# Patient Record
Sex: Female | Born: 1980
Health system: Southern US, Community
[De-identification: ages and names within clinical notes are randomized; demographics above are authoritative.]

## PROBLEM LIST (undated history)

## (undated) DIAGNOSIS — B019 Varicella without complication: Secondary | ICD-10-CM

## (undated) DIAGNOSIS — Z803 Family history of malignant neoplasm of breast: Secondary | ICD-10-CM

## (undated) DIAGNOSIS — O88219 Thromboembolism in pregnancy, unspecified trimester: Secondary | ICD-10-CM

## (undated) DIAGNOSIS — O88213 Thromboembolism in pregnancy, third trimester: Secondary | ICD-10-CM

## (undated) HISTORY — PX: WISDOM TOOTH EXTRACTION: SHX21

## (undated) HISTORY — DX: Family history of malignant neoplasm of breast: Z80.3

## (undated) HISTORY — DX: Varicella without complication: B01.9

## (undated) HISTORY — DX: Thromboembolism in pregnancy, unspecified trimester: O88.219

## (undated) HISTORY — DX: Thromboembolism in pregnancy, third trimester: O88.213

---

## 2011-06-06 LAB — HM MAMMOGRAPHY: HM Mammogram: NORMAL

## 2014-12-05 NOTE — L&D Delivery Note (Signed)
Delivery Summary for Andrea Perkins  Labor Events:   Preterm labor:   Rupture date:   Rupture time:   Rupture type: Artificial  Fluid Color: Light Meconium  Induction:   Augmentation:   Complications:   Cervical ripening:          Delivery:   Episiotomy:   Lacerations:   Repair suture:   Repair # of packets:   Blood loss (ml):  250   Information for the patient's newborn:  Janesa, Dockery [[id number]]    Delivery 09/24/2015 1:02 AM by  Vaginal, Spontaneous Delivery Sex:  female Gestational Age: [redacted]w[redacted]d Delivery Clinician:  [place]?: Yes        APGARS  One minute Five minutes Ten minutes  Skin color: 1   1      Heart rate: 2   2      Grimace: 2   2      Muscle tone: 2   2      Breathing: 2   2      Totals: 9  9      Presentation/position:      Resuscitation: None  Cord information: 3 vessels   Disposition of cord blood:     Blood gases sent?  Complications:   Placenta: Delivered: 09/24/2015 1:06 AM  Spontaneous  Intact appearance Newborn Measurements: Weight: 8 lb 11.3 oz (3950 g)  Height: 22"  Head circumference: 35 cm  Chest circumference: 35 cm  Other providers: Registered Nurse Transition RN Marcille Buffy  Additional  information: Forceps:   Vacuum:   Breech:   Observed anomalies       Delivery Note At 1:02 AM a viable and healthy female was delivered via Vaginal, Spontaneous Delivery (Presentation: cephalic; LOA position  ).  APGAR: 9, 9; weight 8 lb 11.3 oz (3950 g).   Placenta status: Intact, Spontaneous.  Cord: 3 vessels with the following complications: nuchal cord x 1, reducible.  Cord pH: not obtained.  Cord blood not obtained.  Anesthesia: Epidural  Episiotomy:   Lacerations: 1st degree;Perineal Suture Repair: 3.0 (in figure-of-eight fashion) Est. Blood Loss (mL): 250  Mom to postpartum.  Baby to Couplet care / Skin to Skin.  Andrea Perkins 09/24/2015, 1:35 AM

## 2015-01-06 LAB — HM PAP SMEAR: HM Pap smear: NORMAL

## 2015-01-14 ENCOUNTER — Ambulatory Visit (INDEPENDENT_AMBULATORY_CARE_PROVIDER_SITE_OTHER): Payer: BLUE CROSS/BLUE SHIELD | Admitting: Internal Medicine

## 2015-01-14 ENCOUNTER — Encounter: Payer: Self-pay | Admitting: Internal Medicine

## 2015-01-14 VITALS — BP 100/64 | HR 70 | Temp 98.0°F | Ht 63.75 in | Wt 109.0 lb

## 2015-01-14 DIAGNOSIS — G5712 Meralgia paresthetica, left lower limb: Secondary | ICD-10-CM | POA: Insufficient documentation

## 2015-01-14 DIAGNOSIS — Z23 Encounter for immunization: Secondary | ICD-10-CM

## 2015-01-14 DIAGNOSIS — Z0000 Encounter for general adult medical examination without abnormal findings: Secondary | ICD-10-CM | POA: Insufficient documentation

## 2015-01-14 MED ORDER — HYDROCORTISONE 2.5 % EX CREA: TOPICAL_CREAM | Freq: Three times a day (TID) | CUTANEOUS | Status: DC | PRN

## 2015-01-14 NOTE — Assessment & Plan Note (Signed)
Reassured--this is not a serious process

## 2015-01-14 NOTE — Addendum Note (Signed)
Addended by: Lurlean Nanny on: 01/14/2015 01:03 PM   Modules accepted: Orders

## 2015-01-14 NOTE — Progress Notes (Signed)
Pre visit review using our clinic review tool, if applicable. No additional management support is needed unless otherwise documented below in the visit note. 

## 2015-01-14 NOTE — Assessment & Plan Note (Signed)
Seems healthy Tdap today Will give Rx for hydrocortisone cream to try on the hemorrhoid

## 2015-01-14 NOTE — Progress Notes (Signed)
Subjective:    Patient ID: Andrea Perkins, female    DOB: Apr 21, 1981, 34 y.o.   MRN: [id number]  HPI Wanted to get established Has a number of concerns Did just see her gyn--- blood work reviewed  Has had some hemorrhoid problems since last child Has "bump" externally that bothers her Flares at times Used OTC cream but it didn't help much Some itching but no bleeding No constipation or straining  Some pain in upper left thigh Had ultrasound at vein doctor--everything was fine Mostly lateral on left thigh--feels funny like when kids are on it Noted that it worsened with each child and persists now No problems with exercise, etc  Having trouble conceiving again 3 children now but no success in past year Ovarian cyst found but not painful just last week May have had brief rupture last year--had severe pain briefly  Having trouble with her hair Thinning and brittle ?related to heat No scaling, etc  No current outpatient prescriptions on file prior to visit.   No current facility-administered medications on file prior to visit.    Allergies  Allergen Reactions  . Amoxicillin Anaphylaxis  . Milk-Related Compounds Hives    Along with congestion    Past Medical History  Diagnosis Date  . Chicken pox     Past Surgical History  Procedure Laterality Date  . Wisdom tooth extraction    . Wisdom tooth extraction    . Vaginal delivery      x 3    Family History  Problem Relation Age of Onset  . Cancer Mother     Breast  . Hypertension Mother   . Hyperlipidemia Mother   . Thyroid disease Sister   . Alcohol abuse Brother   . Drug abuse Brother   . Alcohol abuse Maternal Grandfather   . Drug abuse Maternal Grandfather   . Mental illness Maternal Grandfather     ?bipolar  . Cancer Brother     "pleural" cancer    History   Social History  . Marital Status: Married    Spouse Name: N/A  . Number of Children: 3  . Years of Education: N/A   Occupational  History  . Stay at home mom    Social History Main Topics  . Smoking status: Never Smoker   . Smokeless tobacco: Never Used  . Alcohol Use: No  . Drug Use: Not on file  . Sexual Activity: Not on file   Other Topics Concern  . Not on file   Social History Narrative  . No narrative on file   Review of Systems  Constitutional: Negative for fatigue and unexpected weight change.  HENT: Negative for dental problem, hearing loss and tinnitus.   Eyes: Negative for visual disturbance.  Respiratory: Negative for cough, chest tightness and shortness of breath.   Gastrointestinal: Negative for nausea, vomiting, abdominal pain, constipation and blood in stool.  Endocrine: Negative for polydipsia and polyuria.  Genitourinary: Negative for difficulty urinating and dyspareunia.       Periods are regular  Musculoskeletal: Negative for back pain, joint swelling and arthralgias.  Skin: Negative for rash.  Allergic/Immunologic: Negative for environmental allergies and immunocompromised state.  Neurological: Positive for numbness. Negative for dizziness, syncope, weakness, light-headedness and headaches.  Hematological: Negative for adenopathy. Does not bruise/bleed easily.  Psychiatric/Behavioral: Negative for sleep disturbance and dysphoric mood. The patient is not nervous/anxious.        Objective:   Physical Exam  Constitutional: She is oriented to person,  place, and time. She appears well-developed and well-nourished. No distress.  HENT:  Head: Normocephalic and atraumatic.  Right Ear: External ear normal.  Left Ear: External ear normal.  Mouth/Throat: Oropharynx is clear and moist. No oropharyngeal exudate.  Hair and scalp look fine  Eyes: Conjunctivae and EOM are normal. Pupils are equal, round, and reactive to light.  Neck: Normal range of motion. Neck supple. No thyromegaly present.  Cardiovascular: Normal rate, regular rhythm, normal heart sounds and intact distal pulses.  Exam  reveals no gallop.   No murmur heard. Pulmonary/Chest: Effort normal and breath sounds normal. No respiratory distress. She has no wheezes. She has no rales.  Abdominal: Soft. There is no tenderness.  Musculoskeletal: She exhibits no edema or tenderness.  Lymphadenopathy:    She has no cervical adenopathy.  Neurological: She is alert and oriented to person, place, and time.  Skin: No rash noted. No erythema.  Psychiatric: She has a normal mood and affect. Her behavior is normal.          Assessment & Plan:

## 2015-02-03 ENCOUNTER — Ambulatory Visit
Admit: 2015-02-03 | Disposition: A | Discharge status: Home / Self Care | Payer: Self-pay | Attending: Internal Medicine | Admitting: Internal Medicine

## 2015-02-03 DIAGNOSIS — O88219 Thromboembolism in pregnancy, unspecified trimester: Secondary | ICD-10-CM

## 2015-02-03 HISTORY — DX: Thromboembolism in pregnancy, unspecified trimester: O88.219

## 2015-02-17 LAB — OB RESULTS CONSOLE HGB/HCT, BLOOD
HCT: 37 %
Hemoglobin: 12.6 g/dL

## 2015-02-17 LAB — OB RESULTS CONSOLE HIV ANTIBODY (ROUTINE TESTING): HIV: NONREACTIVE

## 2015-02-17 LAB — OB RESULTS CONSOLE RPR: RPR: NONREACTIVE

## 2015-02-17 LAB — OB RESULTS CONSOLE GC/CHLAMYDIA
CHLAMYDIA, DNA PROBE: NEGATIVE
Gonorrhea: NEGATIVE

## 2015-02-17 LAB — OB RESULTS CONSOLE ABO/RH: RH Type: POSITIVE

## 2015-02-17 LAB — OB RESULTS CONSOLE VARICELLA ZOSTER ANTIBODY, IGG: VARICELLA IGG: IMMUNE

## 2015-02-17 LAB — OB RESULTS CONSOLE HEPATITIS B SURFACE ANTIGEN: HEP B S AG: NEGATIVE

## 2015-02-17 LAB — OB RESULTS CONSOLE ANTIBODY SCREEN: Antibody Screen: NEGATIVE

## 2015-02-17 LAB — OB RESULTS CONSOLE RUBELLA ANTIBODY, IGM: Rubella: IMMUNE

## 2015-02-28 ENCOUNTER — Inpatient Hospital Stay: Payer: Self-pay | Admitting: Internal Medicine

## 2015-02-28 DIAGNOSIS — R0602 Shortness of breath: Secondary | ICD-10-CM | POA: Diagnosis not present

## 2015-02-28 LAB — CBC
HCT: 35.9 % (ref 35.0–47.0)
HGB: 12 g/dL (ref 12.0–16.0)
MCH: 29.5 pg (ref 26.0–34.0)
MCHC: 33.4 g/dL (ref 32.0–36.0)
MCV: 88 fL (ref 80–100)
PLATELETS: 256 10*3/uL (ref 150–440)
RBC: 4.07 10*6/uL (ref 3.80–5.20)
RDW: 12.4 % (ref 11.5–14.5)
WBC: 9.4 10*3/uL (ref 3.6–11.0)

## 2015-02-28 LAB — TSH: Thyroid Stimulating Horm: <0.1 u[IU]/mL

## 2015-02-28 LAB — APTT: ACTIVATED PTT: 26.7 s (ref 23.6–35.9)

## 2015-02-28 LAB — COMPREHENSIVE METABOLIC PANEL
ANION GAP: 6 — AB (ref 7–16)
Albumin: 3.9 g/dL
Alkaline Phosphatase: 60 U/L
BILIRUBIN TOTAL: 0.4 mg/dL
BUN: <5 mg/dL
CO2: 24 mmol/L
CREATININE: 0.58 mg/dL
Calcium, Total: 9 mg/dL
Chloride: 105 mmol/L
Glucose: 110 mg/dL — ABNORMAL HIGH
Potassium: 3.8 mmol/L
SGOT(AST): 19 U/L
SGPT (ALT): 23 U/L
Sodium: 135 mmol/L
Total Protein: 7.3 g/dL

## 2015-02-28 LAB — URINALYSIS, COMPLETE
BLOOD: NEGATIVE
Bilirubin,UR: NEGATIVE
GLUCOSE, UR: NEGATIVE mg/dL (ref 0–75)
KETONE: NEGATIVE
NITRITE: NEGATIVE
Ph: 7 (ref 4.5–8.0)
Protein: NEGATIVE
RBC,UR: 1 /HPF (ref 0–5)
Specific Gravity: 1.01 (ref 1.003–1.030)
Squamous Epithelial: 5
WBC UR: 2 /HPF (ref 0–5)

## 2015-02-28 LAB — MAGNESIUM: Magnesium: 1.9 mg/dL

## 2015-02-28 LAB — LIPASE, BLOOD: Lipase: 34 U/L

## 2015-02-28 LAB — TROPONIN I
Troponin-I: <0.03 ng/mL
Troponin-I: <0.03 ng/mL

## 2015-02-28 LAB — HCG, QUANTITATIVE, PREGNANCY: Beta Hcg, Quant.: 225359 m[IU]/mL — ABNORMAL HIGH

## 2015-02-28 LAB — PROTIME-INR
INR: 1.1
PROTHROMBIN TIME: 14.4 s

## 2015-02-28 LAB — CK-MB
CK-MB: 0.4 ng/mL — AB
CK-MB: 0.4 ng/mL — AB

## 2015-02-28 LAB — HEMOGLOBIN A1C: Hemoglobin A1C: 5.2 %

## 2015-03-01 LAB — CBC WITH DIFFERENTIAL/PLATELET
BASOS PCT: 0.3 %
Basophil #: 0 10*3/uL (ref 0.0–0.1)
EOS ABS: 0 10*3/uL (ref 0.0–0.7)
EOS PCT: 0.7 %
HCT: 30.1 % — AB (ref 35.0–47.0)
HGB: 10.5 g/dL — ABNORMAL LOW (ref 12.0–16.0)
LYMPHS PCT: 29.6 %
Lymphocyte #: 1.8 10*3/uL (ref 1.0–3.6)
MCH: 30.3 pg (ref 26.0–34.0)
MCHC: 34.8 g/dL (ref 32.0–36.0)
MCV: 87 fL (ref 80–100)
Monocyte #: 0.6 x10 3/mm (ref 0.2–0.9)
Monocyte %: 9 %
NEUTROS ABS: 3.7 10*3/uL (ref 1.4–6.5)
Neutrophil %: 60.4 %
Platelet: 219 10*3/uL (ref 150–440)
RBC: 3.46 10*6/uL — ABNORMAL LOW (ref 3.80–5.20)
RDW: 12.3 % (ref 11.5–14.5)
WBC: 6.2 10*3/uL (ref 3.6–11.0)

## 2015-03-01 LAB — TROPONIN I: Troponin-I: <0.03 ng/mL

## 2015-03-01 LAB — COMPREHENSIVE METABOLIC PANEL
ALK PHOS: 51 U/L
AST: 15 U/L
Albumin: 3.1 g/dL — ABNORMAL LOW
Anion Gap: 5 — ABNORMAL LOW (ref 7–16)
BILIRUBIN TOTAL: 0.4 mg/dL
BUN: <5 mg/dL
CALCIUM: 8.3 mg/dL — AB
CHLORIDE: 110 mmol/L
CREATININE: 0.44 mg/dL
Co2: 20 mmol/L — ABNORMAL LOW
EGFR (African American): >60
EGFR (Non-African Amer.): >60
Glucose: 85 mg/dL
POTASSIUM: 3.7 mmol/L
SGPT (ALT): 17 U/L
Sodium: 135 mmol/L
Total Protein: 6.1 g/dL — ABNORMAL LOW

## 2015-03-01 LAB — HEPARIN LEVEL (UNFRACTIONATED): Anti-Xa(Unfractionated): 0.69 IU/mL (ref 0.30–0.70)

## 2015-03-01 LAB — URINE CULTURE

## 2015-03-01 LAB — CK-MB: CK-MB: 0.4 ng/mL — AB

## 2015-03-02 ENCOUNTER — Encounter: Payer: Self-pay | Admitting: Internal Medicine

## 2015-03-06 ENCOUNTER — Ambulatory Visit
Admit: 2015-03-06 | Disposition: A | Discharge status: Home / Self Care | Payer: Self-pay | Attending: Internal Medicine | Admitting: Internal Medicine

## 2015-03-09 ENCOUNTER — Ambulatory Visit
Admit: 2015-03-09 | Disposition: A | Discharge status: Home / Self Care | Payer: Self-pay | Attending: Internal Medicine | Admitting: Internal Medicine

## 2015-03-09 LAB — HEPARIN LEVEL (UNFRACTIONATED): ANTI-XA(UNFRACTIONATED): 0.66 [IU]/mL (ref 0.30–0.70)

## 2015-03-19 ENCOUNTER — Encounter
Admit: 2015-03-19 | Disposition: A | Discharge status: Home / Self Care | Payer: Self-pay | Attending: Obstetrics & Gynecology | Admitting: Obstetrics & Gynecology

## 2015-03-26 ENCOUNTER — Other Ambulatory Visit: Payer: Self-pay | Admitting: Oncology

## 2015-03-26 DIAGNOSIS — I2699 Other pulmonary embolism without acute cor pulmonale: Secondary | ICD-10-CM

## 2015-03-27 ENCOUNTER — Encounter: Payer: Self-pay | Admitting: Internal Medicine

## 2015-04-05 NOTE — Consult Note (Signed)
Referral Information:  Reason for Referral Acute pulmonary embolism in pregnancy   Referring Physician [facility]   Prenatal Hx Andrea Perkins is a 34 year-old G4 P3003 at 11 6/7 weeks who was diagnosed with an acute pulmonary embolism on 02/28/15. She presented to the ED with RUQ and right-sided chest pain. Her RUQ [place] was negative but sprial CT angiogram showed an acute pulmonary embolism. Her lower extremity dopplers were negative. She did not require intubation or ICU admission.  She is doing well. She is on therapeutic Lovenox (45 mg every 12 hours) with levels being followed by Dr. Aggie Cosier. She has had no bleedign complications and is without complaints.   Past Obstetrical Hx G4 P3003 2007: spontaneous vaginal delivery at 41 weeks, female fetus 8 pounds 0 ounces, sponteanous labor, no complications 2585: spontaneous vaginal delivery at 41 weeks, Induction of labor, female fetus 8 pounds 11 ounces, no complications 2778: spontaneous vaginal delivery at 40 weeks, spontaneous labor, female fetus 8 pounds, no complcations.   Home Medications: Medication Instructions Status  enoxaparin 45 milligram(s) subcutaneous 2 times a day Active  Diclegis 10 mg-10 mg oral delayed release tablet 1 to 2 tab(s) orally once a day (at bedtime) as needed for nausea, vomiting Active   Allergies:   Amoxicillin: Anaphylaxis, Itching  Lactose: Do NOT Use  Vital Signs/Notes:  Nursing Vital Signs: **Vital Signs.:   14-Apr-16 13:19  Pulse Pulse 242  Systolic BP Systolic BP 353  Diastolic BP (mmHg) Diastolic BP (mmHg) 62   Perinatal Consult:  PGyn Hx Denies history of abnormal paps or STDs   Past Medical History cont'd Acute Pulmonary embolism 2016 No other mecical problems   PSurg Hx None   FHx Denies FH of birth defects, mental retardation or geneic disorders. 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   Soc Hx married, Denies use of tobacco, ETOH or drugs   Review Of Systems:  Cough  No   Abdominal Pain No   SOB/DOE No   Chest Pain No    Additional Lab/Radiology Notes CT angiogram [facility], 02/28/15): Pulmonary embolism in right middle and lower lobes CXR [facility], 02/28/15): Normal  RUQ [place] [facility], 02/28/15): Normal except gallbladder polyps Ob US [facility], 02/28/15): Single live IUP at 10 1/7 weeks LE dopplers [facility], 02/28/15): No evidence of DVT Cardiac echo [facility], 02/28/15): LVEF 60-65%, Normal Right RV function  Thrombophilia panel [facility], 02/28/15): Protein S total 82%, Protein C antigen 74%, Prothrombin Gene mutation normal, Factor V Leiden no mutation, AT III 101%, anti caridiolipin antibody neg   Impression/Recommendations:  Impression 34 year-old G4 P3003 at 3 6/7 weeks recently diagnosed with an acute pulmonary embolism during pregnancy. She is stable, in on therapeutic lovenox without complications and being followed by Dr. Aggie Cosier.   Recommendations 1. Acute PE in pregnancy. We discussed the need to stay on therapeutic lovenox therapy during pregnancy and the immediate postpartum period. Dr. Aggie Cosier will then determine the total length of therapy following the pregnancy. He is also following her levels now and adjusting her lovenox dosing appropriately.  We have added an anti-beta2 glycoprotein antiboyd testing today. If positive, will recommned adding a daily baby aspirin.  Recs: -Continue therapeutic lovenox during the pregnancy wtih dose-adjustments by Dr. Aggie Cosier -Scheduled induction of labor at 39 weeks, having held her Lovenox for 24hours piror -SCDs on while in labor -Restart Lovenox 12 hours after a vaginal delivery or 24 hours after a cesarean delivery. Dr. Aggie Cosier can help with postpartum dosing recommnedations. Continue  Lovenox for the immediate postpartum period and then Dr. Aggie Cosier will help determine when she can transition to coumadin. -Detailed anatomy scan at 18 weeks, followed by monthly growth scans starting at 24 weeks. We will be happy to  perform any of these scans if desired -Weekly fetal testing to begin at 32 weeks, increasing to twice weekly at 34 weeks -Avoid estrogen-containing contraception -She declines aneuploidy and ONTD screening. -We also sent a CBC to ensure her platelet count is normal.    Total Time Spent with Patient 60 minutes   >50% of visit spent in couseling/coordination of care yes   Office Use Only 99244  Level 4 (52min) NEW office consult low complexity   Coding Description: OTHER: Pulmonary embolism in pregnancy.  Electronic Signatures: Tadao Emig, [place] (MD)  (Signed 14-Apr-16 16:08)  Authored: Referral, Home Medications, Allergies, Vital Signs/Notes, Consult, Exam, Lab/Radiology Notes, Impression, Billing, Coding Description   Last Updated: 14-Apr-16 16:08 by Jermell Holeman, [place] (MD)

## 2015-04-05 NOTE — H&P (Signed)
PATIENT NAME:  Andrea Perkins, Andrea Perkins MR#:  [id number] DATE OF BIRTH:  [birth year]  DATE OF ADMISSION:  02/28/2015  PRIMARY CARE PHYSICIAN: Venia Carbon, MD    HISTORY OF PRESENT ILLNESS: The patient is a 35 year old Caucasian female with no significant past medical history, who has presented to the hospital with complaints of right rib pain, right shoulder pain, back pain, which started on Thursday, 2 days ago, in the evening. Pain is constant with intermittent worsening, but especially it is uncomfortable whenever she takes deep breaths or when she lies down on the right side or lies down on her back. She denies any fevers or chills, but admits to having some breathing difficulty with pain. Because of the discomfort, she decided to come to the Emergency Room for further evaluation. In the Emergency Room, she had CT scan of her chest, which revealed pulmonary embolism and hospital services were contacted for admission.   PAST MEDICAL HISTORY: None, except of nausea and vomiting because of her pregnancy. She is [redacted] weeks pregnant.   MEDICATIONS: Diclegis 10 mg/10 mg delayed release tablets once at bedtime as needed for nausea, vomiting.   PAST SURGICAL HISTORY:  Wisdom teeth removed.   ALLERGIES: AMOXICILLIN, WHICH SWELLS HER THROAT.   FAMILY HISTORY: Hypertension and hyperlipidemia in the patient's mother, who also had breast carcinoma. The patient's sister had losses of pregnancy. The patient's brother died of pleural-based carcinoma at the age of 36.   SOCIAL HISTORY: The patient is married, has 3 children at home, who she delivered one after the other, and now she is pregnant child with the 4th child, 10 weeks pregnancy. No smoking or alcohol abuse. 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   REVIEW OF SYSTEMS:  CONSTITUTIONAL: Positive for pains in the right side of her body, weight loss approximately 6 pounds over the past 10 weeks because of nausea,  vomiting. Admits to having some shortness of breath with nausea, vomiting. Feeling presyncopal earlier today. Denies any fevers, chills, fatigue, weakness in the muscles.  HEENT: Denies blurry vision, double vision, glaucoma, or cataract, no swollen glands,  difficulty swallowing.  RESPIRATORY: Denies cough, wheezes, hemoptysis, asthma, COPD.  CARDIOVASCULAR: Denies any chest pain, orthopnea, edema. No palpitations or syncope.  GASTROINTESTINAL: Denies any diarrhea, rectal bleeding, change in bowel habits.  GENITOURINARY: Denies dysuria, hematuria, frequency, incontinence.  ENDOCRINE: Denies thyroid problems, heat or cold intolerance.  HEMATOLOGIC: Denies anemia, easy bruising, bleeding, swollen glands. SKIN: Denies acne, rash, or change in moles.  MUSCULOSKELETAL: Denies arthritis, pain, swelling.  NEUROLOGICAL: Denies any numbness, epilepsy or tremor.  PSYCHIATRIC: Denies anxiety, insomnia, or depression.   PHYSICAL EXAMINATION:  VITAL SIGNS:  On arrival to the hospital, the patient's vital signs: Temperature was 98, pulse was 106, respirations 24, blood pressure 119/70, saturation was 99% on room air.  GENERAL: This is a well-developed, well-nourished Caucasian female, pale and uncomfortable lying on the stretcher.  HEENT: Her pupils are equal and reactive to light. Extraocular intact, no icterus or conjunctivitis. Has normal hearing. No pharyngeal erythema, mucosa is moist.  NECK: No masses. Supple, nontender. Thyroid is not enlarged, no lymphadenopathy, no JVD or carotid bruits bilaterally. Full range of motion.  LUNGS: Clear to auscultation in all fields. A few crackles were heard on the right and somewhat diminished breath sounds on the right because of poor entrance, poor effort. No labored inspirations, dullness to percussion. Not in overt respiratory distress.  CARDIOVASCULAR: S1,  S2 appreciated. Rate and rhythm was regular.  2 plus pedal pulses. No lower extremity edema, calf  tenderness, or cyanosis was noted.  ABDOMEN: Soft, nontender. Bowel sounds are present. No hepatosplenomegaly or masses were noted.   RECTAL: Deferred.  MUSCLE STRENGTH: Able to move all extremities. No cyanosis, degenerative joint disease, or kyphosis. Gait was not tested.  SKIN: Revealed no rashes, lesions, erythema, nodularity, or induration. It was warm and dry to palpation.  LYMPHATIC: No adenopathy in the cervical region.  NEUROLOGICAL: Cranial nerves II through XII intact. Sensory is intact. No dysarthria or aphasia. The patient is alert, oriented to time, person and place, cooperative. Memory is good.  PSYCHIATRIC: No significant confusion, agitation, or depression noted.   LABORATORY DATA: Glucose level of 110, otherwise unremarkable. Lipase level of 34, hCG beta subunit is 225,359. Liver enzymes normal. Troponin less than 0.03. CBC: Within normal limits, with white blood cell count 9.4, hemoglobin 12.0, platelet count 256,000. Coagulation panel was unremarkable. Urinalysis revealed 1+ bacteria, 2 white blood cells, 1 red blood cell, trace leukocyte esterase, and 5 epithelial cells, mucus was present as well as amorphous crystals, hazy urine color.   RADIOLOGIC STUDIES: The patient did have chest x-ray, portable single view, 02/28/2015, showed no acute cardiopulmonary disease. No displaced rib fractures were identified. CT scan of chest with IV contrast for pulmonary embolism evaluation showed pulmonary embolus in pulmonary arteries on the right middle and lower lung lobe, extended distally in distal segmental branches, moderate clot burden noted, right ventricular/ left ventricular ratio remains within normal limits at 0.76, small focal pulmonary infarct in the right lung base, minimal atelectasis in the medial right upper lobe. Ultrasound of abdomen, limited survey, done 02/28/2015, showed echogenic foci adherent to the gallbladder wall, most consistent with gallbladder polyps, largest  measures 4.7 mm. Recommends sonographic follow up in 1 year. No cholelithiasis or findings of cholecystitis. No biliary dilatation. OB/GYN ultrasound showed single live intrauterine pregnancy, estimated gestational age [redacted] weeks, 1 day for estimated date of delivery on 09/25/2015. Corpus luteal cyst in the left ovary, normal right ovary.   ASSESSMENT AND PLAN: 1. Pulmonary embolism. Admit the patient to the medical floor. Start her on Lovenox subcutaneously. Get hematologist/oncologist involved, and get lab studies to rule out coagulopathy, including anticardiolipin, antithrombin III, factor V Leiden mutation, as well as the factor II mutation analysis. : We will get Doppler ultrasound of her lower extremities, as well as echocardiogram, and we will continue oxygen therapy for now.  2. Nausea, vomiting related to pregnancy. We will continue IV fluids, as well as antiemetics. 3. Hyperglycemia. Get hemoglobin A1c.  4. Intrauterine pregnancy, live at 10 weeks. Get GYN involved for further recommendations.  5. Bacteruria as well as urinary tract infection. Get urine cultures and start the patient on Rocephin.  TIME SPENT: 50 minutes on the patient.    ____________________________ Theodoro Grist, MD rv:mw D: 02/28/2015 09:10:00 ET T: 02/28/2015 10:48:06 ET JOB#: [id number]  cc: Venia Carbon, MD Theodoro Grist, MD, <Dictator>   Delvecchio Madole MD ELECTRONICALLY SIGNED 03/15/2015 15:01

## 2015-04-05 NOTE — Consult Note (Signed)
PATIENT NAME:  Andrea Perkins, Andrea Perkins MR#:  [id number] DATE OF BIRTH:  [birth year]  DATE OF CONSULTATION:  02/28/2015  REFERRING PHYSICIAN:     Theodoro Grist, MD CONSULTING PHYSICIAN:  Hanford Lust R. Ma Hillock, MD  REASON FOR CONSULTATION: Pulmonary embolism in pregnant patient.   HISTORY OF PRESENT ILLNESS: The patient is a 34 year old female patient with no significant past medical history except for early pregnancy (ultrasound on admission estimates at 10 weeks 1 day intrauterine pregnancy), who presented with complaints of right lower chest pain and rib pain that started on Thursday. The pain has been constant and she had further evaluation including CT scan of the chest which revealed right middle lobe and right lower lobe pulmonary embolism with moderate clot burden. The patient has been started on Lovenox 1 mg/kg twice daily, says that currently pain has settled down with medication, and she has just been started on Lovenox today (morning). She denies any prior history of DVT or PE. Venous Doppler today is negative for any DVT either. She denies any known history of thromboembolic disorders in her parents or siblings, states that she has 6 siblings. This is first  pregnancy and states that she has had significant issues with nausea and emesis in the last 7 to 8 weeks and this has prevented her from feeling well and has been mostly resting or sitting for many weeks now.   PAST MEDICAL HISTORY AND PAST SURGICAL HISTORY: As stated above. Status post wisdom teeth removal.   FAMILY HISTORY: Remarkable for hypertension, hyperlipidemia in her mother who also had breast cancer. The patient's sister has history of pregnancy loss, but denies any history of antiphospholipid antibody syndrome. The patient's brother died of pleural-based carcinoma at age 29.   SOCIAL HISTORY: Denies smoking, alcohol, or recreational drug usage. Worked as a Pharmacist, hospital in the past. She has 3 children at home and currently pregnant with 4th  child.   ALLERGIES: INCLUDE AMOXICILLIN.   HOME MEDICATIONS: Diclegis 10 mg delayed release at bedtime as needed for nausea or  vomiting.   REVIEW OF SYSTEMS:  CONSTITUTIONAL: Generalized weakness for the last few weeks. No fever or chills. No night sweats.  HEENT: Currently denies any dizziness at rest, headaches, epistaxis. No ear or jaw pain. No sinus symptoms.  CARDIAC: No angina, palpitation, orthopnea, or PND.  LUNGS: Denies any dyspnea at rest, cough, hemoptysis. Right lower chest pain as above.  GASTROINTESTINAL: Having intermittent nausea and vomiting. No diarrhea. No bright red blood in stools or melena.  GENITOURINARY: No dysuria or hematuria.  SKIN: No new rashes or pruritus.  MUSCULOSKELETAL: No new bone pains.  EXTREMITIES: No new swelling or paresthesias.   NEUROLOGIC: No new focal weakness, seizures, or loss of consciousness.   PHYSICAL EXAMINATION:  GENERAL: The patient is sitting in bed, alert and oriented, and converses appropriately, no acute distress at rest. No icterus.  VITAL SIGNS: Temperature 97.9, heart rate 77, respiratory rate 20, blood pressure 101/66, oxygen saturation 99% on room air.  HEENT: Normocephalic, atraumatic. Extraocular movements intact. Sclerae anicteric.  NECK: Negative for lymphadenopathy.  CARDIOVASCULAR: S1 and S2, regular rate and rhythm.  LUNGS: Show bilateral good air entry, no crepitations or rhonchi.  ABDOMEN: Soft, nontender. No hepatosplenomegaly clinically. Bowel sounds present.  EXTREMITIES: Show no major edema or cyanosis.  LYMPHATICS: No adenopathy in axillary or inguinal areas.  NEUROLOGIC: Grossly nonfocal, cranial nerves intact.   LABORATORY DATA: INR 1.1, PTT 26.7. Hemoglobin 12.0, platelets 256,000, WBC 9400, MCV 88. Creatinine 0.58, BUN less than  5, calcium 9.0. LFTs unremarkable, albumin 3.9.   IMPRESSION AND RECOMMENDATIONS: A 34 year old female patient with no significant past medical history as described above who is  gravida 4, para 3, and currently at 10.1 weeks with 4th pregnancy. Lower extremity Doppler is negative for deep vein thrombosis. Echo Doppler is pending. Obstetric/gynecologic consultation is pending. Risk factors identifiable for thromboembolism include pregnant state and decreased level of physical activity the last few weeks due to feeling sick and nausea. Hypercoagulable state workup has already been sent by hospitalist including anticardiolipin antibody, antithrombin, factor II mutation analysis, factor V Leiden. Agree with anticoagulation with low-molecular weight heparin Lovenox 1 mg/kg twice daily. Will request anti-Xa-activity at 3 to 4 hours after third dose of Lovenox tomorrow morning. She will likely need evaluation and followup by [facility] during pregnancy given high-risk pregnancy. Otherwise, continue on Lovenox anticoagulation for the duration of pregnancy. We will arrange for outpatient followup once hypercoagulable workup comes back. The patient has been encouraged to stay physically active as much as possible. She was explained about details and agreeable to this plan.   Thank you for the consult. Please feel free to contact me for any additional questions.    ____________________________ Rhett Bannister Ma Hillock, MD srp:TT D: 03/01/2015 00:47:07 ET T: 03/01/2015 01:34:10 ET JOB#: [id number]  cc: Caralee Morea R. Ma Hillock, MD, <Dictator> Alveta Heimlich MD ELECTRONICALLY SIGNED 03/01/2015 10:34

## 2015-04-05 NOTE — Discharge Summary (Signed)
PATIENT NAME:  Andrea Perkins, Andrea Perkins MR#:  [id number] DATE OF BIRTH:  [birth year]  DATE OF ADMISSION:  02/28/2015 DATE OF DISCHARGE:  03/01/2015  ADMITTING DIAGNOSIS: Pulmonary embolism.   DISCHARGE DIAGNOSES:  1.  Pulmonary embolism.  2.  Right-sided chest pain due to pulmonary embolism, pleurisy.  3.  Nausea and intermittent vomiting.  4.  Hyperglycemia with hemoglobin A1c 5.2, resolved, and no diabetes.  5.  Bacteruria. Urine cultures are pending. 6.  Intrauterine pregnancy of 10.1 weeks.   DISCHARGE CONDITION: Stable.   DISCHARGE MEDICATIONS: 1.  The patient is to continue Diclegis 10/10 mg delayed capsule 1-2 tablets at bedtime as needed.  2.  Tylenol and hydrocodone 325/5 mg 1 tablet every 4 hours as needed.  3.  Lovenox 45 mg subcutaneously twice daily.  4.  Promethazine 25 mg every 6 hours as needed.  5.  Phenergan 25 mg rectal suppository every 6 hours as needed.  6.  Docusate sodium 100 mg twice daily as needed.  7.  Nitrofurantoin 100 mg twice daily for 5 days.  HOME OXYGEN: None.   DIET: Regular, regular consistency.  ACTIVITY LIMITATIONS: As tolerated.  FOLLOWUP: Followup appointment with Dr. Silvio Pate in 2 days after discharge, Dr. Ma Hillock in 1 week after discharge, her primary GYN in 1 week after discharge.  CONSULTANTS: Care management; social work; Dr. Marcelline Mates, GYN; Dr. Ma Hillock, oncology.   RADIOLOGIC STUDIES: Chest x-ray, portable, single view, 02/28/2015 revealed no acute cardiopulmonary process. No displaced rib fractures identified. A CT scan of the chest with IV contrast for pulmonary embolism evaluation 02/28/2015 revealed pulmonary embolus within the pulmonary arteries to the right, middle, and lower lung lobes extending distally into subsegmental branches. Moderate clot burden noted. Right ventricular and left ventricular ratio remains within normal limits at 0.76. Small focal pulmonary infarct at the right lung base. Minimal atelectasis within the medial right upper  lobe. Ultrasound of abdomen, limited survey, 02/28/2015 showed echogenic foci adherent to gallbladder wall most consistent with gallbladder polyps; the largest measures 4.7 mm. Recommend sonographic followup in 1 year. No cholelithiasis or findings of cholecystitis. No biliary dilatation. Ultrasound of fetus 02/28/2015. Vaginal ultrasound revealed a single live intrauterine pregnancy, estimated gestational age [redacted] weeks 1 day, for estimated date of delivery 09/25/2015. Corpus luteal cyst in left ovary. Normal right ovary. Ultrasound of lower extremities 02/28/2015 showed no evidence of deep vein thrombosis in either lower extremity. Echocardiogram 02/28/2015 revealed essentially normal study. Left ventricular ejection fraction by visual estimation 60% to 65%. Normal global left ventricular systolic function. Normal right ventricular size and systolic function.  HISTORY: The patient is a 34 year old Caucasian female with a history of pregnancy and nausea, vomiting who has been sick for the past few weeks and lying in bed. Presents to the hospital with complaints of right-sided pains, right shoulder pain, right rib area pain, as well as back pain which started approximately 2 days ago prior to coming to the Emergency Room. She admitted of pleurisy and pain increasing with deep breaths moving around.  ARRIVAL VITAL SIGNS: Temperature was 98, pulse was 106, respiration rate was 24, blood pressure 118/70, saturation was 99% on room air.  PHYSICAL EXAMINATION: Revealed a few crackles on the right and diminished breath sounds on the right because of poor inspiratory effort. The patient was not in overt respiratory distress. Otherwise, physical examination was unremarkable.  LABORATORY DATA: Done on arrival to the hospital showed elevated glucose level of 110; otherwise, BMP was unremarkable. The patient's hCG beta subunit was 225,259. The  patient's liver enzymes were normal. Cardiac enzymes were normal x 4. The  patient's TSH was less than 0.1. The patient's white blood cell count was normal at 9.4, hemoglobin was 12.0, platelet count was 256,000. Coagulation panel was unremarkable. The patient's urinalysis was remarkable for 1 red blood cell, 2 white blood cells, trace leukocyte esterase, negative for nitrites, and mucus was present as well as amorphous crystals. Urine culture did not show any growth. Urine culture results came back after patient left the hospital. EKG showed normal sinus rhythm with sinus arrhythmia at 96 beats per minute, nonspecific ST-T changes, prolonged QTc to 469 ms.  HOSPITAL COURSE: The patient was admitted to the hospital for further evaluation. She was initiated on Lovenox subcutaneously and was told how to inject. Patient was ordered to get multiple studies for hypercoagulable workup including anticardiolipin antibodies, antithrombin 3, factor II mutation analysis, factor V Leiden mutation, protein S and C. She also had echocardiogram done as well as Doppler ultrasound of her lower extremities. She was consulted by oncologist, Dr. Ma Hillock, who felt that the patient's pulmonary embolism likely occurred due to her pregnancy. Since she had risk factors such as pregnancy as well as decreased level of physical activity over the past few weeks due to feeling sick and nauseated, so felt that patient would likely need to be evaluated and followed by [facility] during the pregnancy, given high-risk pregnancy, and recommended to continue Lovenox anticoagulation during the duration of pregnancy. She was encouraged to excersize as much as possible. She was also seen by Dr. Marcelline Mates, [place] physician, who felt that patient's nausea, vomiting of pregnancy was not well managed with Diclegis at home, and she recommended to continue Phenergan instead of Zofran due to its increased risk of birth defects associated with Zofran. She recommended to continue adequate hydration the rest of the pregnancy. She  discussed treatment of pulmonary embolism in pregnancy. She explained to the patient that treatment with Lovenox does not interfere with pregnancy; however, if patient was noted to have coagulopathy, this could increase chances of miscarriage. However, Dr. Marcelline Mates also felt and discussed with patient that since patient had 3 pregnancies successfully to term, it is unlikely. She recommended additional OB followup as an outpatient. The patient was explained and was agreeable to this therapy. She was ready to be to be discharged to home on the 03/01/2015.  DISCHARGE VITAL SIGNS: On the day of discharge, temperature was 98, pulse was 80, respirations were 20, blood pressure 108/72, saturation was 97% on room air at rest and 93% on exertion.   TIME SPENT: 40 minutes on the patient.    ____________________________ Theodoro Grist, MD rv:ST D: 03/02/2015 12:15:55 ET T: 03/03/2015 01:24:03 ET JOB#: [id number]  cc: Theodoro Grist, MD, <Dictator> Venia Carbon, MD Sandeep R. Ma Hillock, MD Patient's OB/GYN physician   Calub Tarnow MD ELECTRONICALLY SIGNED 03/15/2015 15:19

## 2015-04-06 ENCOUNTER — Ambulatory Visit: Payer: Self-pay | Admitting: Oncology

## 2015-04-06 ENCOUNTER — Other Ambulatory Visit: Payer: Self-pay

## 2015-04-09 ENCOUNTER — Inpatient Hospital Stay: Payer: BLUE CROSS/BLUE SHIELD | Admitting: Oncology

## 2015-04-09 ENCOUNTER — Inpatient Hospital Stay: Payer: BLUE CROSS/BLUE SHIELD | Attending: Oncology

## 2015-04-09 VITALS — Wt 104.3 lb

## 2015-04-09 DIAGNOSIS — I2699 Other pulmonary embolism without acute cor pulmonale: Secondary | ICD-10-CM

## 2015-04-09 DIAGNOSIS — O88219 Thromboembolism in pregnancy, unspecified trimester: Secondary | ICD-10-CM | POA: Insufficient documentation

## 2015-05-01 ENCOUNTER — Other Ambulatory Visit: Payer: Self-pay | Admitting: Oncology

## 2015-05-01 DIAGNOSIS — I2782 Chronic pulmonary embolism: Secondary | ICD-10-CM

## 2015-05-05 ENCOUNTER — Inpatient Hospital Stay: Payer: BLUE CROSS/BLUE SHIELD | Admitting: Oncology

## 2015-05-05 ENCOUNTER — Inpatient Hospital Stay: Payer: BLUE CROSS/BLUE SHIELD

## 2015-05-05 VITALS — Wt 110.2 lb

## 2015-05-05 DIAGNOSIS — O88219 Thromboembolism in pregnancy, unspecified trimester: Secondary | ICD-10-CM | POA: Diagnosis not present

## 2015-05-05 DIAGNOSIS — I2699 Other pulmonary embolism without acute cor pulmonale: Secondary | ICD-10-CM

## 2015-05-05 DIAGNOSIS — I2782 Chronic pulmonary embolism: Secondary | ICD-10-CM

## 2015-05-05 LAB — HEPARIN LEVEL (UNFRACTIONATED): Heparin Unfractionated: 0.73 IU/mL — ABNORMAL HIGH (ref 0.30–0.70)

## 2015-05-07 ENCOUNTER — Encounter: Payer: BLUE CROSS/BLUE SHIELD | Admitting: Oncology

## 2015-05-07 ENCOUNTER — Other Ambulatory Visit: Payer: BLUE CROSS/BLUE SHIELD

## 2015-05-14 ENCOUNTER — Other Ambulatory Visit: Payer: Self-pay

## 2015-05-14 DIAGNOSIS — Z369 Encounter for antenatal screening, unspecified: Secondary | ICD-10-CM

## 2015-05-15 ENCOUNTER — Encounter: Payer: Self-pay | Admitting: Obstetrics and Gynecology

## 2015-05-15 ENCOUNTER — Ambulatory Visit: Payer: BLUE CROSS/BLUE SHIELD

## 2015-05-15 ENCOUNTER — Ambulatory Visit (INDEPENDENT_AMBULATORY_CARE_PROVIDER_SITE_OTHER): Payer: BLUE CROSS/BLUE SHIELD | Admitting: Obstetrics and Gynecology

## 2015-05-15 VITALS — BP 110/66 | HR 82 | Wt 111.0 lb

## 2015-05-15 DIAGNOSIS — O0992 Supervision of high risk pregnancy, unspecified, second trimester: Secondary | ICD-10-CM | POA: Diagnosis not present

## 2015-05-15 DIAGNOSIS — O88819 Other embolism in pregnancy, unspecified trimester: Secondary | ICD-10-CM

## 2015-05-15 DIAGNOSIS — Z369 Encounter for antenatal screening, unspecified: Secondary | ICD-10-CM

## 2015-05-15 DIAGNOSIS — O88219 Thromboembolism in pregnancy, unspecified trimester: Secondary | ICD-10-CM | POA: Insufficient documentation

## 2015-05-15 DIAGNOSIS — Z803 Family history of malignant neoplasm of breast: Secondary | ICD-10-CM | POA: Insufficient documentation

## 2015-05-15 DIAGNOSIS — O210 Mild hyperemesis gravidarum: Secondary | ICD-10-CM | POA: Insufficient documentation

## 2015-05-15 LAB — POCT URINALYSIS DIPSTICK
Bilirubin, UA: NEGATIVE
Blood, UA: NEGATIVE
Glucose, UA: NEGATIVE
KETONES UA: NEGATIVE
LEUKOCYTES UA: NEGATIVE
NITRITE UA: NEGATIVE
PROTEIN UA: NEGATIVE
Spec Grav, UA: 1.01
UROBILINOGEN UA: 0.2
pH, UA: 7.5

## 2015-05-15 NOTE — Progress Notes (Signed)
ROB: Patient denies complaints currently.  Notes that over past several weeks she has been having occasional episodes of "blacking out".  Notes that after resuming PNV (which she had not been taking due to nausea/vomiting), the episodes have ceased.  Is complaint with Lovenox and f/u with Hematologist. S/p anatomy scan today, normal.  Initiated conversation regarding scheduling delivery and discontinuing Lovenox approximately 12 hours prior. Also discussed use of epidural in labor if desired, or spinal in event of C-section if no epidural received. Patient cannot receive these modalities if Lovenox has not been stopped in adequate enough time and otherwise patient would require general anesthesia in case of emergency delivery.  Patient notes understanding.

## 2015-06-01 ENCOUNTER — Other Ambulatory Visit: Payer: Self-pay | Admitting: *Deleted

## 2015-06-01 ENCOUNTER — Encounter: Payer: Self-pay | Admitting: Oncology

## 2015-06-01 ENCOUNTER — Other Ambulatory Visit: Payer: Self-pay

## 2015-06-01 DIAGNOSIS — O88219 Thromboembolism in pregnancy, unspecified trimester: Secondary | ICD-10-CM

## 2015-06-02 ENCOUNTER — Other Ambulatory Visit: Payer: Self-pay | Admitting: *Deleted

## 2015-06-02 ENCOUNTER — Encounter: Payer: Self-pay | Admitting: Oncology

## 2015-06-02 DIAGNOSIS — O88219 Thromboembolism in pregnancy, unspecified trimester: Secondary | ICD-10-CM

## 2015-06-02 NOTE — Progress Notes (Signed)
This encounter was created in error - please disregard.

## 2015-06-03 ENCOUNTER — Inpatient Hospital Stay: Payer: BLUE CROSS/BLUE SHIELD | Admitting: Oncology

## 2015-06-03 ENCOUNTER — Inpatient Hospital Stay: Payer: BLUE CROSS/BLUE SHIELD | Attending: Oncology

## 2015-06-03 VITALS — Wt 116.8 lb

## 2015-06-03 DIAGNOSIS — O88219 Thromboembolism in pregnancy, unspecified trimester: Secondary | ICD-10-CM

## 2015-06-03 DIAGNOSIS — Z86711 Personal history of pulmonary embolism: Secondary | ICD-10-CM | POA: Diagnosis not present

## 2015-06-03 LAB — HEPARIN LEVEL (UNFRACTIONATED): Heparin Unfractionated: 0.45 IU/mL (ref 0.30–0.70)

## 2015-06-11 ENCOUNTER — Ambulatory Visit (INDEPENDENT_AMBULATORY_CARE_PROVIDER_SITE_OTHER): Payer: BLUE CROSS/BLUE SHIELD | Admitting: Obstetrics and Gynecology

## 2015-06-11 VITALS — BP 97/55 | HR 85 | Wt 118.1 lb

## 2015-06-11 DIAGNOSIS — O88219 Thromboembolism in pregnancy, unspecified trimester: Secondary | ICD-10-CM

## 2015-06-11 DIAGNOSIS — R531 Weakness: Secondary | ICD-10-CM

## 2015-06-11 DIAGNOSIS — O0992 Supervision of high risk pregnancy, unspecified, second trimester: Secondary | ICD-10-CM

## 2015-06-11 DIAGNOSIS — O88819 Other embolism in pregnancy, unspecified trimester: Secondary | ICD-10-CM

## 2015-06-11 DIAGNOSIS — Z331 Pregnant state, incidental: Secondary | ICD-10-CM

## 2015-06-11 DIAGNOSIS — Z1389 Encounter for screening for other disorder: Secondary | ICD-10-CM

## 2015-06-11 LAB — POCT URINALYSIS DIPSTICK
BILIRUBIN UA: NEGATIVE
GLUCOSE UA: NEGATIVE
Ketones, UA: NEGATIVE
Leukocytes, UA: NEGATIVE
Nitrite, UA: NEGATIVE
PH UA: 6
Protein, UA: NEGATIVE
RBC UA: NEGATIVE
Spec Grav, UA: 1.015
Urobilinogen, UA: 0.2

## 2015-06-11 NOTE — Progress Notes (Signed)
ROB: Patient c/o feeling lightheaded, dizzy, especially in first few hours of wakening.  Wonders if iron is low as she has been anemic in each of her pregnancies.  Will check today.  BP lower than normal, advised on increased hydration.  RTC in 4 weeks.

## 2015-06-11 NOTE — Progress Notes (Signed)
ZIKA EXPOSURE SCREEN:  The patient has not traveled to a [place] Virus endemic area within the past 6 months, nor has she had unprotected sex with a partner who has travelled to a [place] endemic region within the past 6 months. The patient has been advised to notify us if these factors change any time during this current pregnancy, so adequate testing and monitoring can be initiated.

## 2015-06-11 NOTE — Progress Notes (Signed)
This encounter was created in error - please disregard.

## 2015-06-12 ENCOUNTER — Telehealth: Payer: Self-pay

## 2015-06-12 LAB — HEMOGLOBIN AND HEMATOCRIT, BLOOD
Hematocrit: 30.8 % — ABNORMAL LOW (ref 34.0–46.6)
Hemoglobin: 10.6 g/dL — ABNORMAL LOW (ref 11.1–15.9)

## 2015-06-12 NOTE — Telephone Encounter (Signed)
-----   Message from Rubie Maid, MD sent at 06/12/2015 12:40 PM EDT ----- Please inform patient that Hgb shows evidence of mild anemia.  Can take a daily iron supplement.

## 2015-06-12 NOTE — Telephone Encounter (Signed)
Pt.notified

## 2015-06-29 ENCOUNTER — Other Ambulatory Visit: Payer: Self-pay

## 2015-06-29 ENCOUNTER — Encounter: Payer: Self-pay | Admitting: Oncology

## 2015-07-09 ENCOUNTER — Encounter: Payer: BLUE CROSS/BLUE SHIELD | Admitting: Obstetrics and Gynecology

## 2015-07-10 ENCOUNTER — Other Ambulatory Visit: Payer: Self-pay | Admitting: *Deleted

## 2015-07-10 ENCOUNTER — Ambulatory Visit (INDEPENDENT_AMBULATORY_CARE_PROVIDER_SITE_OTHER): Payer: BLUE CROSS/BLUE SHIELD | Admitting: Obstetrics and Gynecology

## 2015-07-10 VITALS — BP 100/68 | HR 91 | Wt 121.0 lb

## 2015-07-10 DIAGNOSIS — Z1389 Encounter for screening for other disorder: Secondary | ICD-10-CM | POA: Diagnosis not present

## 2015-07-10 DIAGNOSIS — Z23 Encounter for immunization: Secondary | ICD-10-CM

## 2015-07-10 DIAGNOSIS — O88219 Thromboembolism in pregnancy, unspecified trimester: Secondary | ICD-10-CM

## 2015-07-10 DIAGNOSIS — O88819 Other embolism in pregnancy, unspecified trimester: Secondary | ICD-10-CM

## 2015-07-10 DIAGNOSIS — Z331 Pregnant state, incidental: Secondary | ICD-10-CM | POA: Diagnosis not present

## 2015-07-10 LAB — POCT URINALYSIS DIPSTICK
Bilirubin, UA: NEGATIVE
Glucose, UA: NEGATIVE
Ketones, UA: NEGATIVE
Leukocytes, UA: NEGATIVE
NITRITE UA: NEGATIVE
Protein, UA: NEGATIVE
RBC UA: NEGATIVE
Spec Grav, UA: 1.01
UROBILINOGEN UA: NEGATIVE
pH, UA: 7

## 2015-07-10 NOTE — Patient Instructions (Signed)
Be prepared to stay 1 hour net visit for glucola (diabetes in pregnancy screening).

## 2015-07-12 NOTE — Progress Notes (Signed)
ROB: Patient doing well.  Denies major complaints.  Notes feeling full faster so has to eat smaller portions multiple times daily. For glucola next visit.  Tdap adn blood consent done today.  RTC in 2 weeks.

## 2015-07-13 ENCOUNTER — Inpatient Hospital Stay: Payer: BLUE CROSS/BLUE SHIELD | Admitting: Oncology

## 2015-07-13 ENCOUNTER — Inpatient Hospital Stay: Payer: BLUE CROSS/BLUE SHIELD | Attending: Oncology

## 2015-07-13 VITALS — Wt 122.6 lb

## 2015-07-13 DIAGNOSIS — O88219 Thromboembolism in pregnancy, unspecified trimester: Secondary | ICD-10-CM | POA: Insufficient documentation

## 2015-07-13 LAB — HEPARIN LEVEL (UNFRACTIONATED): HEPARIN UNFRACTIONATED: 0.66 [IU]/mL (ref 0.30–0.70)

## 2015-07-13 NOTE — Progress Notes (Signed)
This encounter was created in error - please disregard.

## 2015-07-20 ENCOUNTER — Other Ambulatory Visit: Payer: Self-pay | Admitting: *Deleted

## 2015-07-20 NOTE — Telephone Encounter (Signed)
I spoke with patient and explained to her that she has refills , she stated that se threw away her box with the rx number on it and the automated system wont let her rf it. I explained that she can call and speak with someone at the pharmacy so she said she would try that

## 2015-07-27 ENCOUNTER — Other Ambulatory Visit: Payer: Self-pay

## 2015-07-27 ENCOUNTER — Encounter: Payer: Self-pay | Admitting: Oncology

## 2015-07-29 ENCOUNTER — Ambulatory Visit (INDEPENDENT_AMBULATORY_CARE_PROVIDER_SITE_OTHER): Payer: BLUE CROSS/BLUE SHIELD | Admitting: Obstetrics and Gynecology

## 2015-07-29 VITALS — BP 101/65 | HR 91 | Wt 126.1 lb

## 2015-07-29 DIAGNOSIS — O0993 Supervision of high risk pregnancy, unspecified, third trimester: Secondary | ICD-10-CM

## 2015-07-29 DIAGNOSIS — O88819 Other embolism in pregnancy, unspecified trimester: Secondary | ICD-10-CM

## 2015-07-29 DIAGNOSIS — Z331 Pregnant state, incidental: Secondary | ICD-10-CM

## 2015-07-29 DIAGNOSIS — Z3490 Encounter for supervision of normal pregnancy, unspecified, unspecified trimester: Secondary | ICD-10-CM

## 2015-07-29 DIAGNOSIS — O88219 Thromboembolism in pregnancy, unspecified trimester: Secondary | ICD-10-CM

## 2015-07-29 LAB — POCT URINALYSIS DIPSTICK
BILIRUBIN UA: NEGATIVE
Glucose, UA: NEGATIVE
KETONES UA: NEGATIVE
LEUKOCYTES UA: NEGATIVE
Nitrite, UA: NEGATIVE
PH UA: 5
RBC UA: NEGATIVE
SPEC GRAV UA: 1.01
Urobilinogen, UA: NEGATIVE

## 2015-07-29 NOTE — Progress Notes (Signed)
ROB: Patient doing well. Denies complaints.  Performing glucola today. Discussed contraception.  Patient considering Mirena IUD vs Nexplanon.  Handouts given on both. To discuss at subsequent visits. RTC in 2 weeks.

## 2015-07-30 LAB — GLUCOSE TOLERANCE, 1 HOUR: GLUCOSE, 1HR PP: 126 mg/dL (ref 65–199)

## 2015-08-11 ENCOUNTER — Encounter: Payer: Self-pay | Admitting: Obstetrics and Gynecology

## 2015-08-11 ENCOUNTER — Ambulatory Visit (INDEPENDENT_AMBULATORY_CARE_PROVIDER_SITE_OTHER): Payer: BLUE CROSS/BLUE SHIELD | Admitting: Obstetrics and Gynecology

## 2015-08-11 DIAGNOSIS — O88213 Thromboembolism in pregnancy, third trimester: Secondary | ICD-10-CM

## 2015-08-11 DIAGNOSIS — Z1389 Encounter for screening for other disorder: Secondary | ICD-10-CM

## 2015-08-11 DIAGNOSIS — O0993 Supervision of high risk pregnancy, unspecified, third trimester: Secondary | ICD-10-CM

## 2015-08-11 LAB — POCT URINALYSIS DIPSTICK
Bilirubin, UA: NEGATIVE
Glucose, UA: NEGATIVE
KETONES UA: NEGATIVE
Leukocytes, UA: NEGATIVE
NITRITE UA: NEGATIVE
PH UA: 6.5
Protein, UA: NEGATIVE
RBC UA: NEGATIVE
Spec Grav, UA: <=1.005
Urobilinogen, UA: NEGATIVE

## 2015-08-11 NOTE — Progress Notes (Signed)
ROB: Complains of brittle/breaking nails.  Advised on vitamin supplements as needed.  Normal glucola done.  Will have growth scan at next visit for h/o Lovenox use in pregnancy. RTC in 2 weeks.

## 2015-08-24 ENCOUNTER — Telehealth: Payer: Self-pay

## 2015-08-24 ENCOUNTER — Inpatient Hospital Stay: Payer: BLUE CROSS/BLUE SHIELD | Attending: Oncology | Admitting: *Deleted

## 2015-08-24 ENCOUNTER — Inpatient Hospital Stay: Payer: BLUE CROSS/BLUE SHIELD | Admitting: Oncology

## 2015-08-24 DIAGNOSIS — Z86718 Personal history of other venous thrombosis and embolism: Secondary | ICD-10-CM | POA: Diagnosis present

## 2015-08-24 DIAGNOSIS — O88219 Thromboembolism in pregnancy, unspecified trimester: Secondary | ICD-10-CM

## 2015-08-24 LAB — HEPARIN LEVEL (UNFRACTIONATED): HEPARIN UNFRACTIONATED: 0.13 [IU]/mL — AB (ref 0.30–0.70)

## 2015-08-24 NOTE — Telephone Encounter (Signed)
Patient aware.

## 2015-08-24 NOTE — Telephone Encounter (Signed)
Patient here for weight check and is concerned about the knots on her stomach from the Lovenox inj.  Wanting to know if she could give injections in her buttocks does not want to give injections in her legs.

## 2015-08-24 NOTE — Telephone Encounter (Signed)
That should be ok 

## 2015-08-26 ENCOUNTER — Ambulatory Visit (INDEPENDENT_AMBULATORY_CARE_PROVIDER_SITE_OTHER): Payer: BLUE CROSS/BLUE SHIELD | Admitting: Obstetrics and Gynecology

## 2015-08-26 ENCOUNTER — Ambulatory Visit: Payer: BLUE CROSS/BLUE SHIELD

## 2015-08-26 ENCOUNTER — Encounter: Payer: Self-pay | Admitting: Obstetrics and Gynecology

## 2015-08-26 VITALS — BP 93/60 | HR 85 | Wt 127.9 lb

## 2015-08-26 DIAGNOSIS — O0993 Supervision of high risk pregnancy, unspecified, third trimester: Secondary | ICD-10-CM

## 2015-08-26 DIAGNOSIS — Z3493 Encounter for supervision of normal pregnancy, unspecified, third trimester: Secondary | ICD-10-CM | POA: Diagnosis not present

## 2015-08-26 DIAGNOSIS — O88213 Thromboembolism in pregnancy, third trimester: Secondary | ICD-10-CM

## 2015-08-26 DIAGNOSIS — O88219 Thromboembolism in pregnancy, unspecified trimester: Secondary | ICD-10-CM

## 2015-08-26 DIAGNOSIS — O88819 Other embolism in pregnancy, unspecified trimester: Secondary | ICD-10-CM

## 2015-08-26 LAB — POCT URINALYSIS DIPSTICK
Bilirubin, UA: NEGATIVE
Blood, UA: NEGATIVE
GLUCOSE UA: NEGATIVE
KETONES UA: NEGATIVE
Nitrite, UA: NEGATIVE
PROTEIN UA: NEGATIVE
Spec Grav, UA: 1.01
Urobilinogen, UA: 0.2
pH, UA: 7.5

## 2015-08-26 NOTE — Progress Notes (Signed)
ROB: Patient doing well.  Notes occasional [address].  Scheduled for IOL 09/22/15 at 8 pm.  To stop Lovenox after morning dose. Last Anti-Xa level subtherapeutic.  To address with Heme/Onc. Declines flu vaccine.  Normal growth scan. RTC in 1 week.  For 36 week labs at that time.

## 2015-08-26 NOTE — Progress Notes (Signed)
DECLINES FLU VACCINE

## 2015-08-30 NOTE — Progress Notes (Signed)
This encounter was created in error - please disregard.

## 2015-09-02 ENCOUNTER — Encounter: Payer: Self-pay | Admitting: Obstetrics and Gynecology

## 2015-09-02 ENCOUNTER — Ambulatory Visit (INDEPENDENT_AMBULATORY_CARE_PROVIDER_SITE_OTHER): Payer: BLUE CROSS/BLUE SHIELD | Admitting: Obstetrics and Gynecology

## 2015-09-02 VITALS — BP 97/63 | HR 86 | Wt 129.5 lb

## 2015-09-02 DIAGNOSIS — Z3493 Encounter for supervision of normal pregnancy, unspecified, third trimester: Secondary | ICD-10-CM

## 2015-09-02 DIAGNOSIS — Z36 Encounter for antenatal screening of mother: Secondary | ICD-10-CM

## 2015-09-02 DIAGNOSIS — Z3685 Encounter for antenatal screening for Streptococcus B: Secondary | ICD-10-CM

## 2015-09-02 DIAGNOSIS — Z113 Encounter for screening for infections with a predominantly sexual mode of transmission: Secondary | ICD-10-CM

## 2015-09-02 LAB — POCT URINALYSIS DIPSTICK
Bilirubin, UA: NEGATIVE
Blood, UA: NEGATIVE
Glucose, UA: NEGATIVE
KETONES UA: NEGATIVE
LEUKOCYTES UA: NEGATIVE
Nitrite, UA: NEGATIVE
PH UA: 7.5
PROTEIN UA: NEGATIVE
SPEC GRAV UA: 1.01
UROBILINOGEN UA: 0.2

## 2015-09-02 NOTE — Progress Notes (Signed)
36 week cultures today 

## 2015-09-03 NOTE — Progress Notes (Signed)
ROB: Patient doing well. Denies complaints. 36 week cultures performed today. Discussed PTL precautions.  RTC in 1 week.

## 2015-09-04 LAB — GC/CHLAMYDIA PROBE AMP
CHLAMYDIA, DNA PROBE: NEGATIVE
Neisseria gonorrhoeae by PCR: NEGATIVE

## 2015-09-05 LAB — STREP GP B NAA+RFLX: Strep Gp B NAA+Rflx: NEGATIVE

## 2015-09-07 ENCOUNTER — Inpatient Hospital Stay: Payer: BLUE CROSS/BLUE SHIELD | Attending: Oncology | Admitting: Oncology

## 2015-09-07 VITALS — BP 107/63 | HR 85 | Temp 97.1°F | Wt 128.9 lb

## 2015-09-07 DIAGNOSIS — O88219 Thromboembolism in pregnancy, unspecified trimester: Secondary | ICD-10-CM

## 2015-09-07 DIAGNOSIS — O88813 Other embolism in pregnancy, third trimester: Secondary | ICD-10-CM

## 2015-09-07 DIAGNOSIS — Z7901 Long term (current) use of anticoagulants: Secondary | ICD-10-CM

## 2015-09-09 ENCOUNTER — Ambulatory Visit (INDEPENDENT_AMBULATORY_CARE_PROVIDER_SITE_OTHER): Payer: BLUE CROSS/BLUE SHIELD | Admitting: Obstetrics and Gynecology

## 2015-09-09 VITALS — BP 103/66 | HR 84 | Temp 97.8°F | Wt 129.2 lb

## 2015-09-09 DIAGNOSIS — O0993 Supervision of high risk pregnancy, unspecified, third trimester: Secondary | ICD-10-CM

## 2015-09-09 DIAGNOSIS — O88219 Thromboembolism in pregnancy, unspecified trimester: Secondary | ICD-10-CM

## 2015-09-09 DIAGNOSIS — O88819 Other embolism in pregnancy, unspecified trimester: Secondary | ICD-10-CM

## 2015-09-09 LAB — POCT URINALYSIS DIP (MANUAL ENTRY)
Bilirubin, UA: NEGATIVE
GLUCOSE UA: NEGATIVE
Ketones, POC UA: NEGATIVE
Leukocytes, UA: NEGATIVE
Nitrite, UA: NEGATIVE
Protein Ur, POC: NEGATIVE
RBC UA: NEGATIVE
SPEC GRAV UA: 1.01
UROBILINOGEN UA: 0.2
pH, UA: 7.5

## 2015-09-09 NOTE — Progress Notes (Signed)
ROB: Complains of fatigue but otherwise well.  Denies insomnia. Desires prescription for breast pump. GBS and 36 week labs neg. RTC in 1 week.

## 2015-09-16 NOTE — Progress Notes (Signed)
[name]  [phone] [phone]  ID: Andrea Perkins OB: Oct 17, 1981  MR#: [id number]  DEY#:[id number]  Patient Care Team: Venia Carbon, MD as PCP - General (Internal Medicine) Venia Carbon, MD (Internal Medicine)  CHIEF COMPLAINT:  Chief Complaint  Patient presents with  . Follow-up    INTERVAL HISTORY: Patient returns to clinic today several weeks prior to her due date for further evaluation. She is tolerating her Lovenox well with only increased bruising at the site of her injections. She otherwise feels well. She has no neurologic complaints. She denies any fevers. She has no shortness of breath or cough. She denies any nausea, vomiting, constipation, or diarrhea. She has no urinary complaints. She is gaining weight appropriately. Patient offers no specific complaints today  REVIEW OF SYSTEMS:   Review of Systems  Constitutional: Negative.   Respiratory: Negative.   Cardiovascular: Negative.  Negative for chest pain.  Gastrointestinal: Negative.   Musculoskeletal: Negative.   Neurological: Negative.   Endo/Heme/Allergies: Does not bruise/bleed easily.    As per HPI. Otherwise, a complete review of systems is negatve.  PAST MEDICAL HISTORY: Past Medical History  Diagnosis Date  . Chicken pox   . Pulmonary embolism affecting pregnancy 3/16  . Pulmonary embolism affecting pregnancy 4/16    PAST SURGICAL HISTORY: Past Surgical History  Procedure Laterality Date  . Wisdom tooth extraction    . Wisdom tooth extraction    . Vaginal delivery      x 3    FAMILY HISTORY Family History  Problem Relation Age of Onset  . Hypertension Mother   . Hyperlipidemia Mother   . Cancer Mother 84s    Breast  . Thyroid disease Sister   . Alcohol abuse Brother   . Drug abuse Brother   . Alcohol abuse Maternal Grandfather   . Drug abuse Maternal Grandfather   . Mental illness Maternal Grandfather     ?bipolar  . Cancer Brother      "pleural" cancer       ADVANCED DIRECTIVES:    HEALTH MAINTENANCE: Social History  Substance Use Topics  . Smoking status: Never Smoker   . Smokeless tobacco: Never Used  . Alcohol Use: No     Colonoscopy:  PAP:  Bone density:  Lipid panel:  Allergies  Allergen Reactions  . Amoxicillin Anaphylaxis and Rash    this medication makes pt's ears itch.  . Amoxicillin Itching  . Lactulose Other (See Comments)  . Milk-Related Compounds Hives    Along with congestion    Current Outpatient Prescriptions  Medication Sig Dispense Refill  . Biotin 1000 MCG tablet Take 1,000 mcg by mouth 3 (three) times daily.    Marland Kitchen enoxaparin (LOVENOX) 80 MG/0.8ML injection INJECT 70 MILLIGRAMS (0.7 ML) INJECTABLE ONCE A DAY  5  . ferrous sulfate 325 (65 FE) MG tablet Take 325 mg by mouth daily with breakfast.    . Prenatal Vit-Fe Fumarate-FA (PRENATAL ONE DAILY PO) Take 1 capsule by mouth daily.     No current facility-administered medications for this visit.    OBJECTIVE: Filed Vitals:   09/07/15 1140  BP: 107/63  Pulse: 85  Temp: 97.1 F (36.2 C)     Body mass index is 21.21 kg/(m^2).    ECOG FS:0 - Asymptomatic  General: Well-developed, well-nourished, no acute distress. Eyes: Pink conjunctiva, anicteric sclera. Lungs: Clear to auscultation bilaterally. Heart: Regular rate and rhythm. No rubs, murmurs, or gallops. Abdomen: Appears appropriate for gestational age. Musculoskeletal: No  edema, cyanosis, or clubbing. Neuro: Alert, answering all questions appropriately. Cranial nerves grossly intact. Skin: No rashes or petechiae noted. Psych: Normal affect.   LAB RESULTS:  Lab Results  Component Value Date   NA 135 03/01/2015   K 3.7 03/01/2015   CL 110 03/01/2015   CO2 20* 03/01/2015   GLUCOSE 85 03/01/2015   BUN <5 03/01/2015   CREATININE 0.44 03/01/2015   CALCIUM 8.3* 03/01/2015   PROT 6.1* 03/01/2015   ALBUMIN 3.1* 03/01/2015   AST 15 03/01/2015   ALT 17 03/01/2015     ALKPHOS 51 03/01/2015   BILITOT 0.4 03/01/2015   GFRNONAA >60 03/01/2015   GFRAA >60 03/01/2015    Lab Results  Component Value Date   WBC 6.2 03/01/2015   NEUTROABS 3.7 03/01/2015   HGB 10.5* 03/01/2015   HCT 30.8* 06/11/2015   MCV 87 03/01/2015   PLT 219 03/01/2015     STUDIES: US Ob Follow Up  09/03/2015  ULTRASOUND REPORT Location: [facility] Date of Service: 08/26/2015 Indications:  Growth Findings: Singleton intrauterine pregnancy is visualized with FHR at 147 BPM. Biometrics give an (U/S) Gestational age of [redacted] weeks and 2 days, and an (U/S) EDD of 09/28/2015; this correlates with the clinically established EDD of 09/27/2015. Fetal presentation is vertex, spine left lateral. EFW: 2641 grams ( 5 lbs. 13 oz. ). Placenta: Anterior, grade 2/3 AFI: Adequate at 11.5 cm. Anatomic survey is normal; Gender - female . Impression: 1. 35 week 2 day Viable Singleton Intrauterine pregnancy by U/S. 2. (U/S) EDD is consistent with Clinically established (LMP) EDD of 09/27/2015. 3. EFW = 2641 grams ( 5 lbs. 13 oz. ). 4. AFI is adequate at 11.5 cm. Recommendations: 1.Clinical correlation with the patient's History and Physical Exam. Elliott,Teresa, Rad Tech I have reviewed this study and agree with documentation. Rubie Maid, MD [facility]    ASSESSMENT: Pulmonary embolism and pregnancy.  PLAN:    1. Pulmonary embolus: CT scan results previously reviewed independently and firming pulmonary embolism. Patient did not have any other obvious risk factors other than pregnancy. Continue Lovenox injections at 1.5 mg/kg daily throughout pregnancy and approximately 6-8 weeks postpartum.  Patient may have to decrease her dose of Lovenox after she gives birth secondary to decreased weight. No follow-up has been scheduled. Please refer patient back if there are any questions or concerns.  Patient expressed understanding and was in agreement with this plan. She also understands that She  can call clinic at any time with any questions, concerns, or complaints.    Lloyd Huger, MD   09/16/2015 10:38 AM

## 2015-09-17 ENCOUNTER — Ambulatory Visit (INDEPENDENT_AMBULATORY_CARE_PROVIDER_SITE_OTHER): Payer: BLUE CROSS/BLUE SHIELD | Admitting: Obstetrics and Gynecology

## 2015-09-17 VITALS — BP 106/72 | HR 92 | Wt 128.6 lb

## 2015-09-17 DIAGNOSIS — O88219 Thromboembolism in pregnancy, unspecified trimester: Secondary | ICD-10-CM

## 2015-09-17 DIAGNOSIS — O0993 Supervision of high risk pregnancy, unspecified, third trimester: Secondary | ICD-10-CM

## 2015-09-17 DIAGNOSIS — O88819 Other embolism in pregnancy, unspecified trimester: Secondary | ICD-10-CM

## 2015-09-17 LAB — POCT URINALYSIS DIP (MANUAL ENTRY)
BILIRUBIN UA: NEGATIVE
BILIRUBIN UA: NEGATIVE
GLUCOSE UA: NEGATIVE
Leukocytes, UA: NEGATIVE
NITRITE UA: NEGATIVE
PH UA: 7.5
Protein Ur, POC: NEGATIVE
RBC UA: NEGATIVE
SPEC GRAV UA: 1.01
Urobilinogen, UA: 0.2

## 2015-09-17 NOTE — Progress Notes (Signed)
ROB: Denies complaint.s  Concerned that she has not gained weight.  Total weight gain 25 lbs, FH appropriate, given reassurance.  Scheduled for IOL on 09/22/15.

## 2015-09-22 ENCOUNTER — Inpatient Hospital Stay
Admission: EM | Admit: 2015-09-22 | Discharge: 2015-09-25 | DRG: 775 | Disposition: A | Discharge status: Home / Self Care | Payer: BLUE CROSS/BLUE SHIELD | Attending: Obstetrics and Gynecology | Admitting: Obstetrics and Gynecology

## 2015-09-22 DIAGNOSIS — Z8759 Personal history of other complications of pregnancy, childbirth and the puerperium: Secondary | ICD-10-CM

## 2015-09-22 DIAGNOSIS — O88213 Thromboembolism in pregnancy, third trimester: Secondary | ICD-10-CM

## 2015-09-22 DIAGNOSIS — Z3493 Encounter for supervision of normal pregnancy, unspecified, third trimester: Secondary | ICD-10-CM | POA: Diagnosis not present

## 2015-09-22 DIAGNOSIS — Z3A39 39 weeks gestation of pregnancy: Secondary | ICD-10-CM | POA: Diagnosis not present

## 2015-09-22 DIAGNOSIS — Z881 Allergy status to other antibiotic agents status: Secondary | ICD-10-CM

## 2015-09-22 DIAGNOSIS — Z3483 Encounter for supervision of other normal pregnancy, third trimester: Secondary | ICD-10-CM | POA: Diagnosis present

## 2015-09-22 DIAGNOSIS — Z86711 Personal history of pulmonary embolism: Secondary | ICD-10-CM

## 2015-09-22 DIAGNOSIS — Z91011 Allergy to milk products: Secondary | ICD-10-CM | POA: Diagnosis not present

## 2015-09-22 HISTORY — DX: Thromboembolism in pregnancy, third trimester: O88.213

## 2015-09-22 LAB — TYPE AND SCREEN
ABO/RH(D): B POS
Antibody Screen: NEGATIVE

## 2015-09-22 LAB — CBC
HCT: 32.1 % — ABNORMAL LOW (ref 35.0–47.0)
Hemoglobin: 10.8 g/dL — ABNORMAL LOW (ref 12.0–16.0)
MCH: 30.7 pg (ref 26.0–34.0)
MCHC: 33.5 g/dL (ref 32.0–36.0)
MCV: 91.7 fL (ref 80.0–100.0)
PLATELETS: 259 10*3/uL (ref 150–440)
RBC: 3.51 MIL/uL — AB (ref 3.80–5.20)
RDW: 13.4 % (ref 11.5–14.5)
WBC: 11.5 10*3/uL — AB (ref 3.6–11.0)

## 2015-09-22 LAB — ABO/RH: ABO/RH(D): B POS

## 2015-09-22 MED ORDER — BUTORPHANOL TARTRATE 1 MG/ML IJ SOLN
1.0000 mg | INTRAMUSCULAR | Status: DC | PRN
Start: 2015-09-22 — End: 2015-09-24

## 2015-09-22 MED ORDER — OXYTOCIN BOLUS FROM INFUSION: 500.0000 mL | INTRAVENOUS | Status: DC

## 2015-09-22 MED ORDER — OXYTOCIN 40 UNITS IN LACTATED RINGERS INFUSION - SIMPLE MED: 62.5000 mL/h | INTRAVENOUS | Status: DC

## 2015-09-22 MED ORDER — OXYTOCIN 40 UNITS IN LACTATED RINGERS INFUSION - SIMPLE MED
1.0000 m[IU]/min | INTRAVENOUS | Status: DC
  Administered 2015-09-23: 2 m[IU]/min via INTRAVENOUS
  Administered 2015-09-23: 4 m[IU]/min via INTRAVENOUS
  Filled 2015-09-22: qty 1000

## 2015-09-22 MED ORDER — LIDOCAINE HCL (PF) 1 % IJ SOLN
30.0000 mL | INTRAMUSCULAR | Status: DC | PRN
  Filled 2015-09-22: qty 30

## 2015-09-22 MED ORDER — ACETAMINOPHEN 325 MG PO TABS: 650.0000 mg | ORAL_TABLET | ORAL | Status: DC | PRN

## 2015-09-22 MED ORDER — LACTATED RINGERS IV SOLN
INTRAVENOUS | Status: DC
  Administered 2015-09-22 – 2015-09-23 (×4): via INTRAVENOUS

## 2015-09-22 MED ORDER — LACTATED RINGERS IV SOLN: 500.0000 mL | INTRAVENOUS | Status: DC | PRN

## 2015-09-22 MED ORDER — TERBUTALINE SULFATE 1 MG/ML IJ SOLN: 0.2500 mg | Freq: Once | INTRAMUSCULAR | Status: DC | PRN

## 2015-09-22 MED ORDER — ONDANSETRON HCL 4 MG/2ML IJ SOLN: 4.0000 mg | Freq: Four times a day (QID) | INTRAMUSCULAR | Status: DC | PRN

## 2015-09-22 MED ORDER — CITRIC ACID-SODIUM CITRATE 334-500 MG/5ML PO SOLN: 30.0000 mL | ORAL | Status: DC | PRN

## 2015-09-22 MED ORDER — MISOPROSTOL 25 MCG QUARTER TABLET
25.0000 ug | ORAL_TABLET | ORAL | Status: DC | PRN
  Administered 2015-09-22: 25 ug via VAGINAL
  Filled 2015-09-22: qty 0.25

## 2015-09-23 ENCOUNTER — Inpatient Hospital Stay: Payer: BLUE CROSS/BLUE SHIELD | Admitting: Certified Registered"

## 2015-09-23 ENCOUNTER — Encounter: Payer: Self-pay | Admitting: Anesthesiology

## 2015-09-23 ENCOUNTER — Other Ambulatory Visit: Payer: Self-pay | Admitting: *Deleted

## 2015-09-23 MED ORDER — FENTANYL 2.5 MCG/ML W/ROPIVACAINE 0.2% IN NS 100 ML EPIDURAL INFUSION (ARMC-ANES)
EPIDURAL | Status: DC | PRN
  Administered 2015-09-23: 8 mL/h via EPIDURAL

## 2015-09-23 MED ORDER — MISOPROSTOL 25 MCG QUARTER TABLET
ORAL_TABLET | ORAL | Status: AC
  Filled 2015-09-23: qty 0.25

## 2015-09-23 MED ORDER — BUPIVACAINE HCL (PF) 0.25 % IJ SOLN
INTRAMUSCULAR | Status: DC | PRN
  Administered 2015-09-23: 5 mL via EPIDURAL

## 2015-09-23 MED ORDER — MISOPROSTOL 200 MCG PO TABS
ORAL_TABLET | ORAL | Status: AC
  Filled 2015-09-23: qty 4

## 2015-09-23 MED ORDER — OXYTOCIN 40 UNITS IN LACTATED RINGERS INFUSION - SIMPLE MED
INTRAVENOUS | Status: AC
  Filled 2015-09-23: qty 1000

## 2015-09-23 MED ORDER — ENOXAPARIN SODIUM 80 MG/0.8ML ~~LOC~~ SOLN: SUBCUTANEOUS | Status: DC

## 2015-09-23 MED ORDER — AMMONIA AROMATIC IN INHA
RESPIRATORY_TRACT | Status: AC
  Filled 2015-09-23: qty 10

## 2015-09-23 MED ORDER — LIDOCAINE-EPINEPHRINE (PF) 1.5 %-1:200000 IJ SOLN
INTRAMUSCULAR | Status: DC | PRN
  Administered 2015-09-23: 3 mL via EPIDURAL

## 2015-09-23 MED ORDER — FENTANYL 2.5 MCG/ML W/ROPIVACAINE 0.2% IN NS 100 ML EPIDURAL INFUSION (ARMC-ANES)
EPIDURAL | Status: AC
  Filled 2015-09-23: qty 100

## 2015-09-23 NOTE — Telephone Encounter (Signed)
This is not on her current med list.

## 2015-09-23 NOTE — Telephone Encounter (Signed)
Ok, please refill.

## 2015-09-23 NOTE — Anesthesia Preprocedure Evaluation (Addendum)
Anesthesia Evaluation  Patient identified by MRN, date of birth, ID band Patient awake    Reviewed: Allergy & Precautions, H&P , Patient's Chart, lab work & pertinent test results  History of Anesthesia Complications Negative for: history of anesthetic complications  Airway Mallampati: II   Neck ROM: full    Dental   Pulmonary           Cardiovascular      Neuro/Psych    GI/Hepatic   Endo/Other    Renal/GU      Musculoskeletal   Abdominal   Peds  Hematology   Anesthesia Other Findings   Reproductive/Obstetrics (+) Pregnancy                             Anesthesia Physical Anesthesia Plan  ASA: II  Anesthesia Plan: Epidural   Post-op Pain Management:    Induction:   Airway Management Planned:   Additional Equipment:   Intra-op Plan:   Post-operative Plan:   Informed Consent: I have reviewed the patients History and Physical, chart, labs and discussed the procedure including the risks, benefits and alternatives for the proposed anesthesia with the patient or authorized representative who has indicated his/her understanding and acceptance.     Plan Discussed with: Anesthesiologist  Anesthesia Plan Comments:         Anesthesia Quick Evaluation

## 2015-09-23 NOTE — Progress Notes (Signed)
Intrapartum Progress Note  S: Patient notes pain with contractions, declines epidural currently but will desire once more active labor commences  O: Blood pressure 119/69, pulse 77, temperature 98.1 F (36.7 C), temperature source Oral, resp. rate 18, height 5\' 4"  (1.626 m), weight 128 lb (58.06 kg), last menstrual period 12/21/2014. Gen App: NAD, comfortable Abdomen: soft, gravid FHT: baseline 145 bpm.  Accels present.  Decels absent. moderate in degree variability.   Tocometer: contractions q 1-4 minutes, irregular Cervix: foley bulb in place. Cervix previously 1/50/-3 Extremities: Nontender, no edema.  Pitocin: 2 mIU  Labs:  No new labs  Assessment:  1: SIUP at [redacted]w[redacted]d 2. H/o PE in pregnancy, previously on Lovenox therapy  Plan:  1. Continue with IOL 2. Increase pitocin to 4 mIU 3. SCDs during labor   Rubie Maid, MD 09/23/2015 12:47 PM

## 2015-09-23 NOTE — H&P (Signed)
Obstetric History and Physical  Andrea Perkins is a 34 y.o. [phone] with IUP at [redacted]w[redacted]d who presented overnight for scheduled IOL for h/o anticoagulant use in pregnancy. Patient states she has been having  infrequent contractions, no vaginal bleeding, intact membranes, with active fetal movement.    Prenatal Course Source of Care: [facility] with onset of care at 8 weeks Pregnancy complications or risks: Patient Active Problem List   Diagnosis Date Noted  . Pulmonary embolism affecting pregnancy in third trimester 09/22/2015  . Supervision of high risk pregnancy in third trimester 07/29/2015  . Hyperemesis affecting pregnancy, antepartum 05/15/2015  . Family history of breast cancer in mother 05/15/2015  . Meralgia paresthetica of left side 01/14/2015   She plans to breastfeed She desires IUD for postpartum contraception.   Prenatal labs and studies: ABO, Rh: --/--/B POS (10/18 2209) Antibody: NEG (10/18 2208) Rubella: Immune (03/15 0000) RPR: Nonreactive (03/15 0000)  HBsAg: Negative (03/15 0000)  HIV: Non-reactive (03/15 0000)  GBS: Negative 1 hr Glucola  normal Genetic screening declined Anatomy US normal  Prenatal Transfer Tool  Maternal Diabetes: No Genetic Screening: Declined Maternal Ultrasounds/Referrals: Normal Fetal Ultrasounds or other Referrals:  Referred to [facility] , Other: Referred to Hematology Maternal Substance Abuse:  No Significant Maternal Medications:  Meds include: Other: Lovenox Significant Maternal Lab Results: Lab values include: Group B Strep negative  Past Medical History  Diagnosis Date  . Chicken pox   . Pulmonary embolism affecting pregnancy 3/16    Past Surgical History  Procedure Laterality Date  . Wisdom tooth extraction      OB History  Gravida Para Term Preterm AB SAB TAB Ectopic Multiple Living  4 3 3       3     # Outcome Date GA Lbr Len/2nd Weight Sex Delivery Anes PTL Lv  4 Current           3 Term  10/11/10   8 lb 6 oz (3.799 kg) F Vag-Spont  N Y  2 Term 02/08/08   8 lb 11 oz (3.941 kg) M Vag-Spont   Y  1 Term 06/11/06   8 lb (3.629 kg) M Vag-Spont EPI  Y      Social History   Social History  . Marital Status: Married    Spouse Name: N/A  . Number of Children: 3  . Years of Education: N/A   Occupational History  . Stay at home mom    Social History Main Topics  . Smoking status: Never Smoker   . Smokeless tobacco: Never Used  . Alcohol Use: No  . Drug Use: No  . Sexual Activity: Yes    Birth Control/ Protection: None     Comment: Pregnant   Other Topics Concern  . None   Social History Narrative   Husband teaches finance at [employer]    Family History  Problem Relation Age of Onset  . Hypertension Mother   . Hyperlipidemia Mother   . Cancer Mother 1s    Breast  . Thyroid disease Sister   . Alcohol abuse Brother   . Drug abuse Brother   . Alcohol abuse Maternal Grandfather   . Drug abuse Maternal Grandfather   . Mental illness Maternal Grandfather     ?bipolar  . Cancer Brother     "pleural" cancer    Prescriptions prior to admission  Medication Sig Dispense Refill Last Dose  . Biotin 1000 MCG tablet Take 1,000 mcg by mouth 3 (three) times  daily.   Taking  . enoxaparin (LOVENOX) 80 MG/0.8ML injection INJECT 70 MILLIGRAMS (0.7 ML) INJECTABLE ONCE A DAY  5 Taking  . ferrous sulfate 325 (65 FE) MG tablet Take 325 mg by mouth daily with breakfast.   Taking  . Prenatal Vit-Fe Fumarate-FA (PRENATAL ONE DAILY PO) Take 1 capsule by mouth daily.   Taking    Allergies  Allergen Reactions  . Amoxicillin Anaphylaxis and Rash    this medication makes pt's ears itch.  . Amoxicillin Itching  . Lactulose Other (See Comments)  . Milk-Related Compounds Hives    Along with congestion    Review of Systems: Negative except for what is mentioned in HPI.  Physical Exam: BP 123/73 mmHg  Pulse 79  Temp(Src) 97.9 F (36.6 C) (Oral)  Resp 17  Ht 5\' 4"  (1.626 m)  Wt  128 lb (58.06 kg)  BMI 21.96 kg/m2  LMP 12/21/2014 CONSTITUTIONAL: Well-developed, well-nourished female in no acute distress.  HENT:  Normocephalic, atraumatic, External right and left ear normal. Oropharynx is clear and moist EYES: Conjunctivae and EOM are normal. Pupils are equal, round, and reactive to light. No scleral icterus.  NECK: Normal range of motion, supple, no masses SKIN: Skin is warm and dry. No rash noted. Not diaphoretic. No erythema. No pallor. [place]: Alert and oriented to person, place, and time. Normal reflexes, muscle tone coordination. No cranial nerve deficit noted. PSYCHIATRIC: Normal mood and affect. Normal behavior. Normal judgment and thought content. CARDIOVASCULAR: Normal heart rate noted, regular rhythm RESPIRATORY: Effort and breath sounds normal, no problems with respiration noted ABDOMEN: Soft, nontender, nondistended, gravid. MUSCULOSKELETAL: Normal range of motion. No edema and no tenderness. 2+ distal pulses.  Cervical Exam: Dilatation 1 cm   Effacement 40%   Station -3   Presentation: cephalic FHT:  Baseline rate 140 bpm   Variability moderate  Accelerations present   Decelerations none Contractions: Every 1-4 mins, irregular   Pertinent Labs/Studies:   Results for orders placed or performed during the hospital encounter of 09/22/15 (from the past 24 hour(s))  CBC     Status: Abnormal   Collection Time: 09/22/15 10:08 PM  Result Value Ref Range   WBC 11.5 (H) 3.6 - 11.0 K/uL   RBC 3.51 (L) 3.80 - 5.20 MIL/uL   Hemoglobin 10.8 (L) 12.0 - 16.0 g/dL   HCT 32.1 (L) 35.0 - 47.0 %   MCV 91.7 80.0 - 100.0 fL   MCH 30.7 26.0 - 34.0 pg   MCHC 33.5 32.0 - 36.0 g/dL   RDW 13.4 11.5 - 14.5 %   Platelets 259 150 - 440 K/uL  Type and screen     Status: None   Collection Time: 09/22/15 10:08 PM  Result Value Ref Range   ABO/RH(D) B POS    Antibody Screen NEG    Sample Expiration 09/25/2015   ABO/Rh     Status: None   Collection Time: 09/22/15 10:09  PM  Result Value Ref Range   ABO/RH(D) B POS     08/26/2015 Growth Ultrasound:  Impression: 1. 35 week 2 day Viable Singleton Intrauterine pregnancy by U/S. 2. (U/S) EDD is consistent with Clinically established (LMP) EDD of 09/27/2015. 3. EFW = 2641 grams ( 5 lbs. 13 oz. ). 4. AFI is adequate at 11.5 cm.   Assessment : Andrea Perkins is a 34 y.o. G4P3003 at [redacted]w[redacted]d being admitted for IOL. H/o PE in pregnancy, treated with Lovenox  Plan: Labor: Has received 1 dose of Cytotec overnight.  Cannot  initiate 2nd dose due to contractions.  Foley bulb placed, and low dose Pitocin initiated.   FWB: Reassuring fetal heart tracing.  GBS negative Delivery plan: Hopeful for vaginal delivery DVT Prophylaxis: Has held Lovenox since Sunday (per Hematology recommendations).  Will use SCDs while in labor, and resume Lovenox postpartum.   Rubie Maid, MD [facility]

## 2015-09-23 NOTE — Anesthesia Procedure Notes (Signed)
Epidural Patient location during procedure: OB Start time: 09/23/2015 3:15 PM End time: 09/23/2015 3:37 PM  Staffing Resident/CRNA: BACHICH, JENNIFER Performed by: resident/CRNA   Preanesthetic Checklist Completed: patient identified, site marked, surgical consent, pre-op evaluation, IV checked, risks and benefits discussed and monitors and equipment checked  Epidural Patient position: sitting Prep: Betadine Patient monitoring: heart rate, continuous pulse ox and blood pressure Approach: midline Location: L3-L4 Injection technique: LOR air  Needle:  Needle type: Tuohy  Needle gauge: 18 G Needle length: 9 cm Needle insertion depth: 4.5 cm Catheter type: closed end flexible Catheter size: 19 Gauge Catheter at skin depth: 9 cm Test dose: negative and 1.5% lidocaine with Epi 1:200 K  Assessment Events: blood not aspirated, injection not painful, no injection resistance, negative IV test and no paresthesia  Additional Notes Reason for block:procedure for pain

## 2015-09-23 NOTE — Telephone Encounter (Signed)
Patietn currently in hospital being induced for labor and she does plan to breast feed. Plans are for discharge home tomorrow

## 2015-09-23 NOTE — Progress Notes (Signed)
Intrapartum Progress Note  S: Patient denies complaints.   O: Blood pressure 105/61, pulse 77, temperature 98 F (36.7 C), temperature source Oral, resp. rate 20, height 5\' 4"  (1.626 m), weight 128 lb (58.06 kg), last menstrual period 12/21/2014. Gen App: NAD, comfortable Abdomen: soft, gravid FHT: baseline 145 bpm.  Accels present.  Decels absent. moderate in degree variability.   Tocometer: contractions q 2-3 minutes, irregular Cervix: 5.5/70/-2/c/AROM Extremities: Nontender, no edema.  Pitocin: 16 mIU  Labs:  No new labs  Assessment:  1: SIUP at [redacted]w[redacted]d 2. H/o PE in pregnancy, previously on Lovenox therapy 3. AROM, light meconium staining  Plan:  1. Continue augmentation with Pitocin 2. SCDs during labor 3. Anticipate vaginal delivery.  Will alert Peds to light meconium at time of delivery.   Rubie Maid, MD [facility]

## 2015-09-23 NOTE — Progress Notes (Signed)
Intrapartum Progress Note  S: Patient denies pain, is s/p epidural.   O: Blood pressure 110/54, pulse 71, temperature 98 F (36.7 C), temperature source Oral, resp. rate 20, height 5\' 4"  (1.626 m), weight 128 lb (58.06 kg), last menstrual period 12/21/2014. Gen App: NAD, comfortable Abdomen: soft, gravid FHT: baseline 145 bpm.  Accels present.  Decels absent. moderate in degree variability.   Tocometer: contractions q 2-3 minutes, irregular Cervix: foley bulb out @ 2:45 pm, now 3.5-4/50/c/-2/posterior Extremities: Nontender, no edema.  Pitocin: 12 mIU  Labs:  No new labs  Assessment:  1: SIUP at [redacted]w[redacted]d 2. H/o PE in pregnancy, previously on Lovenox therapy  Plan:  1. Continue with IOL/Augmentation with Pitocin 2. SCDs during labor 3. Anticipate vaginal delivery   Rubie Maid, MD [facility]

## 2015-09-24 DIAGNOSIS — Z3493 Encounter for supervision of normal pregnancy, unspecified, third trimester: Secondary | ICD-10-CM

## 2015-09-24 LAB — CREATININE, SERUM
Creatinine, Ser: 0.59 mg/dL (ref 0.44–1.00)
GFR calc Af Amer: >60 mL/min (ref >60)

## 2015-09-24 LAB — RPR: RPR: NONREACTIVE

## 2015-09-24 MED ORDER — BENZOCAINE-MENTHOL 20-0.5 % EX AERO
1.0000 "application " | INHALATION_SPRAY | CUTANEOUS | Status: DC | PRN
  Filled 2015-09-24: qty 56

## 2015-09-24 MED ORDER — PRENATAL MULTIVITAMIN CH
1.0000 | ORAL_TABLET | Freq: Every day | ORAL | Status: DC
  Administered 2015-09-24: 1 via ORAL
  Filled 2015-09-24: qty 1

## 2015-09-24 MED ORDER — FERROUS SULFATE 325 (65 FE) MG PO TABS
325.0000 mg | ORAL_TABLET | Freq: Every day | ORAL | Status: DC
  Administered 2015-09-24: 325 mg via ORAL
  Filled 2015-09-24: qty 1

## 2015-09-24 MED ORDER — IBUPROFEN 600 MG PO TABS
600.0000 mg | ORAL_TABLET | Freq: Four times a day (QID) | ORAL | Status: DC
  Administered 2015-09-24 – 2015-09-25 (×4): 600 mg via ORAL
  Filled 2015-09-24 (×4): qty 1

## 2015-09-24 MED ORDER — WITCH HAZEL-GLYCERIN EX PADS: 1.0000 "application " | MEDICATED_PAD | CUTANEOUS | Status: DC | PRN

## 2015-09-24 MED ORDER — ONDANSETRON HCL 4 MG/2ML IJ SOLN: 4.0000 mg | INTRAMUSCULAR | Status: DC | PRN

## 2015-09-24 MED ORDER — OXYCODONE-ACETAMINOPHEN 5-325 MG PO TABS
1.0000 | ORAL_TABLET | ORAL | Status: DC | PRN
  Administered 2015-09-24 (×2): 1 via ORAL
  Filled 2015-09-24: qty 1

## 2015-09-24 MED ORDER — OXYCODONE-ACETAMINOPHEN 5-325 MG PO TABS
ORAL_TABLET | ORAL | Status: AC
  Filled 2015-09-24: qty 1

## 2015-09-24 MED ORDER — SIMETHICONE 80 MG PO CHEW: 80.0000 mg | CHEWABLE_TABLET | ORAL | Status: DC | PRN

## 2015-09-24 MED ORDER — DIBUCAINE 1 % RE OINT: 1.0000 "application " | TOPICAL_OINTMENT | RECTAL | Status: DC | PRN

## 2015-09-24 MED ORDER — ONDANSETRON HCL 4 MG PO TABS: 4.0000 mg | ORAL_TABLET | ORAL | Status: DC | PRN

## 2015-09-24 MED ORDER — ACETAMINOPHEN 325 MG PO TABS: 650.0000 mg | ORAL_TABLET | ORAL | Status: DC | PRN

## 2015-09-24 MED ORDER — ENOXAPARIN SODIUM 80 MG/0.8ML ~~LOC~~ SOLN
70.0000 mg | SUBCUTANEOUS | Status: DC
Start: 2015-09-24 — End: 2015-09-25
  Administered 2015-09-24: 70 mg via SUBCUTANEOUS
  Filled 2015-09-24 (×4): qty 0.8

## 2015-09-24 MED ORDER — SENNOSIDES-DOCUSATE SODIUM 8.6-50 MG PO TABS: 2.0000 | ORAL_TABLET | ORAL | Status: DC

## 2015-09-24 MED ORDER — IBUPROFEN 600 MG PO TABS
ORAL_TABLET | ORAL | Status: AC
  Administered 2015-09-24: 600 mg
  Filled 2015-09-24: qty 1

## 2015-09-24 MED ORDER — ZOLPIDEM TARTRATE 5 MG PO TABS: 5.0000 mg | ORAL_TABLET | Freq: Every evening | ORAL | Status: DC | PRN

## 2015-09-24 MED ORDER — LANOLIN HYDROUS EX OINT: TOPICAL_OINTMENT | CUTANEOUS | Status: DC | PRN

## 2015-09-24 MED ORDER — DIPHENHYDRAMINE HCL 25 MG PO CAPS
25.0000 mg | ORAL_CAPSULE | Freq: Four times a day (QID) | ORAL | Status: DC | PRN
Start: 2015-09-24 — End: 2015-09-25

## 2015-09-25 LAB — CBC
HEMATOCRIT: 30.8 % — AB (ref 35.0–47.0)
HEMOGLOBIN: 10.3 g/dL — AB (ref 12.0–16.0)
MCH: 31 pg (ref 26.0–34.0)
MCHC: 33.3 g/dL (ref 32.0–36.0)
MCV: 93.2 fL (ref 80.0–100.0)
Platelets: 246 10*3/uL (ref 150–440)
RBC: 3.3 MIL/uL — AB (ref 3.80–5.20)
RDW: 13.8 % (ref 11.5–14.5)
WBC: 14.7 10*3/uL — AB (ref 3.6–11.0)

## 2015-09-25 MED ORDER — IBUPROFEN 600 MG PO TABS: 600.0000 mg | ORAL_TABLET | Freq: Four times a day (QID) | ORAL | Status: DC

## 2015-09-25 NOTE — Anesthesia Postprocedure Evaluation (Signed)
  Anesthesia Post-op Note  Patient: Andrea Perkins  Procedure(s) Performed: * No procedures listed *  Anesthesia type:Epidural  Patient location: PACU  Post pain: Pain level controlled  Post assessment: Post-op Vital signs reviewed, Patient's Cardiovascular Status Stable, Respiratory Function Stable, Patent Airway and No signs of Nausea or vomiting  Post vital signs: Reviewed and stable  Last Vitals:  Filed Vitals:   09/24/15 2000  BP: 95/55  Pulse: 73  Temp: 36.4 C  Resp: 18    Level of consciousness: awake, alert  and patient cooperative  Complications: No apparent anesthesia complications

## 2015-09-25 NOTE — Progress Notes (Signed)
D/C order from MD.  Reviewed d/c instructions and prescriptions with patient and answered any questions.  Patient d/c home with infant via wheelchair by nursing/auxillary. 

## 2015-09-25 NOTE — Discharge Instructions (Signed)
General Postpartum Discharge Instructions  Do not drink alcohol or take tranquilizers.  Do not take medicine that has not been prescribed by your doctor.  Take showers instead of baths until your doctor gives you permission to take baths.  No sexual intercourse or placement of anything in the vagina for 6 weeks or as instructed by your doctor. Only take prescription or over-the-counter medicines  for pain, discomfort, or fever as directed by your doctor. Take medicines (antibiotics) that kill germs if they are prescribed for you.   Call the office or go to the Emergency Room if:  You feel sick to your stomach (nauseous).  You start to throw up (vomit).  You have trouble eating or drinking.  You have an oral temperature above 101.  You have constipation that is not helped by adjusting diet or increasing fluid intake. Pain medicines are a common cause of constipation.  You have foul smelling vaginal discharge or odor.  You have bleeding requiring changing more than 1 pad per hour. You have any other concerns.  SEEK IMMEDIATE MEDICAL CARE IF:  You have persistent dizziness.  You have difficulty breathing or shortness of breath.  You have an oral temperature above 102.5, not controlled by medicine.     Please call your doctor or return to the ER if you experience any chest pains, shortness of breath, fever greater than 101, any heavy bleeding or large clots, and foul smelling vaginal discharge, any worsening abdominal pain & cramping that is not controlled by pain medication, or any signs of post partum depression.  No tampons, enemas, douches, or sexual intercourse for 6 weeks.  Also avoid tub baths, hot tubs, or swimming for 6 weeks.  Postpartum Care After Vaginal Delivery After you deliver your newborn (postpartum period), the usual stay in the hospital is 24-72 hours. If there were problems with your labor or delivery, or if you have other medical problems, you might be in the hospital  longer.  While you are in the hospital, you will receive help and instructions on how to care for yourself and your newborn during the postpartum period.  While you are in the hospital:  Be sure to tell your nurses if you have pain or discomfort, as well as where you feel the pain and what makes the pain worse.  If you had an incision made near your vagina (episiotomy) or if you had some tearing during delivery, the nurses may put ice packs on your episiotomy or tear. The ice packs may help to reduce the pain and swelling.  If you are breastfeeding, you may feel uncomfortable contractions of your uterus for a couple of weeks. This is normal. The contractions help your uterus get back to normal size.  It is normal to have some bleeding after delivery.  For the first 1-3 days after delivery, the flow is red and the amount may be similar to a period.  It is common for the flow to start and stop.  In the first few days, you may pass some small clots. Let your nurses know if you begin to pass large clots or your flow increases.  Do not  flush blood clots down the toilet before having the nurse look at them.  During the next 3-10 days after delivery, your flow should become more watery and pink or brown-tinged in color.  Ten to fourteen days after delivery, your flow should be a small amount of yellowish-white discharge.  The amount of your flow will  decrease over the first few weeks after delivery. Your flow may stop in 6-8 weeks. Most women have had their flow stop by 12 weeks after delivery.  You should change your sanitary pads frequently.  Wash your hands thoroughly with soap and water for at least 20 seconds after changing pads, using the toilet, or before holding or feeding your newborn.  You should feel like you need to empty your bladder within the first 6-8 hours after delivery.  In case you become weak, lightheaded, or faint, call your nurse before you get out of bed for the  first time and before you take a shower for the first time.  Within the first few days after delivery, your breasts may begin to feel tender and full. This is called engorgement. Breast tenderness usually goes away within 48-72 hours after engorgement occurs. You may also notice milk leaking from your breasts. If you are not breastfeeding, do not stimulate your breasts. Breast stimulation can make your breasts produce more milk.  Spending as much time as possible with your newborn is very important. During this time, you and your newborn can feel close and get to know each other. Having your newborn stay in your room (rooming in) will help to strengthen the bond with your newborn. It will give you time to get to know your newborn and become comfortable caring for your newborn.  Your hormones change after delivery. Sometimes the hormone changes can temporarily cause you to feel sad or tearful. These feelings should not last more than a few days. If these feelings last longer than that, you should talk to your caregiver.  If desired, talk to your caregiver about methods of family planning or contraception.  Talk to your caregiver about immunizations. Your caregiver may want you to have the following immunizations before leaving the hospital:  Tetanus, diphtheria, and pertussis (Tdap) or tetanus and diphtheria (Td) immunization. It is very important that you and your family (including grandparents) or others caring for your newborn are up-to-date with the Tdap or Td immunizations. The Tdap or Td immunization can help protect your newborn from getting ill.  Rubella immunization.  Varicella (chickenpox) immunization.  Influenza immunization. You should receive this annual immunization if you did not receive the immunization during your pregnancy.   This information is not intended to replace advice given to you by your health care provider. Make sure you discuss any questions you have with your  health care provider.   Document Released: 09/18/2007 Document Revised: 08/15/2012 Document Reviewed: 07/18/2012 Elsevier Interactive Patient Education [employer].

## 2015-09-25 NOTE — Progress Notes (Signed)
Post Partum Day #1 s/p SVD  Subjective: no complaints, up ad lib, voiding and tolerating PO  Objective: Blood pressure 94/62, pulse 73, temperature 97.9 F (36.6 C), temperature source Oral, resp. rate 18, height 5\' 4"  (1.626 m), weight 128 lb (58.06 kg), last menstrual period 12/21/2014, SpO2 98 %, unknown if currently breastfeeding.  Physical Exam:  General: alert and no distress Lochia: appropriate Uterine Fundus: firm Incision: none DVT Evaluation: Negative Homan's sign. No cords or calf tenderness. No significant calf/ankle edema.   Recent Labs  09/22/15 2208 09/25/15 0615  HGB 10.8* 10.3*  HCT 32.1* 30.8*    Assessment/Plan: Discharge home, Breastfeeding and Contraception IUD to be placed postpartum.  Has resumed Lovenox.  Will continue for 6-8 weeks postpartum.    LOS: 3 days   Rubie Maid 09/25/2015, 10:24 AM

## 2015-09-25 NOTE — Discharge Summary (Signed)
Obstetric Discharge Summary Reason for Admission: induction of labor Prenatal Procedures: ultrasound Intrapartum Procedures: spontaneous vaginal delivery Postpartum Procedures: none Complications-Operative and Postpartum: 1st degree perineal laceration   HEMOGLOBIN  Date Value Ref Range Status  09/25/2015 10.3* 12.0 - 16.0 g/dL Final  02/17/2015 12.6 g/dL Final   HGB  Date Value Ref Range Status  03/01/2015 10.5* 12.0-16.0 g/dL Final   HCT  Date Value Ref Range Status  09/25/2015 30.8* 35.0 - 47.0 % Final  03/01/2015 30.1* 35.0-47.0 % Final  02/17/2015 37 % Final   HEMATOCRIT  Date Value Ref Range Status  06/11/2015 30.8* 34.0 - 46.6 % Final    Physical Exam:  General: alert and no distress Lochia: appropriate Uterine Fundus: firm Incision: none DVT Evaluation: No evidence of DVT seen on physical exam. Negative Homan's sign. No cords or calf tenderness. No significant calf/ankle edema.  Discharge Diagnoses: Term Pregnancy-delivered and h/o PE in pregnancy  Discharge Information: Date: 09/25/2015 Activity: pelvic rest Diet: routine Medications: PNV, Ibuprofen and Lovenox Condition: stable Instructions: refer to practice specific booklet Discharge to: home Follow-up Information    Schedule an appointment as soon as possible for a visit with Rubie Maid, MD.   Specialties:  Obstetrics and Gynecology, Radiology   Why:  1 week for lab draw only, 6 weeks for postpartum visit   Contact information:   [place] [address] [place] [address] [phone]       Newborn Data: Live born female  Birth Weight: 8 lb 11.3 oz (3950 g) APGAR: 9, 9  Home with mother.  Rubie Maid 09/25/2015, 10:29 AM

## 2015-10-01 ENCOUNTER — Other Ambulatory Visit: Payer: Self-pay | Admitting: Obstetrics and Gynecology

## 2015-10-01 ENCOUNTER — Other Ambulatory Visit: Payer: BLUE CROSS/BLUE SHIELD

## 2015-10-01 DIAGNOSIS — O88219 Thromboembolism in pregnancy, unspecified trimester: Secondary | ICD-10-CM

## 2015-10-05 ENCOUNTER — Telehealth: Payer: Self-pay

## 2015-10-05 NOTE — Telephone Encounter (Signed)
Call transferred from front desk- pt is s/p svd 09/24/2015. C/o of abn vag bleeding. Has a back ache also. Pt had a tiny laceration with only one stitch. She has felt good up until today. Pt is currently taking blood thinner d/t PE. She is changing her pad q 1-2 hours. No UTI sx. No clots. No fever, n/v/d. Advised to monitor for now. Tylenol for pain. If pt is soaking a pad q 30 minutes or develops fever or pain unrelieved by tylenol she will need to be seen ASAP. Pt voices understanding.

## 2015-10-06 LAB — HEPARIN ANTI-XA: Heparin Anti-Xa: <0.1 IU/mL

## 2015-10-07 ENCOUNTER — Telehealth: Payer: Self-pay

## 2015-10-07 NOTE — Telephone Encounter (Signed)
-----   Message from Rubie Maid, MD sent at 10/06/2015  5:37 PM EDT ----- Please inform patient that Lovenox levels were not at recommended therapeutic level, please ensure that patient is taking medication as prescribed.

## 2015-10-07 NOTE — Telephone Encounter (Signed)
Called pt to inquire about how she is taking her medication, pt states she is taking medication as directed however when blood was drawn she had not had medication in close to 24 hours. Pt states that since delivery she has started taking medication in the evening. Per Dr.Cherry this is ok and pt does need labs repeated.

## 2015-11-03 ENCOUNTER — Encounter: Payer: Self-pay | Admitting: Obstetrics and Gynecology

## 2015-11-03 ENCOUNTER — Ambulatory Visit (INDEPENDENT_AMBULATORY_CARE_PROVIDER_SITE_OTHER): Payer: BLUE CROSS/BLUE SHIELD | Admitting: Obstetrics and Gynecology

## 2015-11-03 DIAGNOSIS — O88219 Thromboembolism in pregnancy, unspecified trimester: Secondary | ICD-10-CM

## 2015-11-03 NOTE — Progress Notes (Signed)
Subjective:     Vikkie Adolphus is a 34 y.o. G28P4004 female who presents for a postpartum visit. She is 6 weeks postpartum following a spontaneous vaginal delivery. I have fully reviewed the prenatal and intrapartum course. The delivery was at 58 gestational weeks (IOL for use of anticoagulants in pregnancy). Outcome: spontaneous vaginal delivery. Anesthesia: epidural. Postpartum course has been well. Baby's course has been well. Baby is feeding by breast. Bleeding no bleeding. Bowel function is normal. Bladder function is normal. Patient is not sexually active. Contraception method is vasectomy. Postpartum depression screening: negative.  The following portions of the patient's history were reviewed and updated as appropriate: allergies, current medications, past family history, past medical history, past social history, past surgical history and problem list.  Review of Systems A comprehensive review of systems was negative.   Objective:    BP 106/71 mmHg  Pulse 65  Ht 5' 3.75" (1.619 m)  Wt 116 lb 12.8 oz (52.98 kg)  BMI 20.21 kg/m2  Breastfeeding? Yes  General:  alert and no distress   Breasts:  inspection negative, no nipple discharge or bleeding, no masses or nodularity palpable  Lungs: clear to auscultation bilaterally  Heart:  regular rate and rhythm, S1, S2 normal, no murmur, click, rub or gallop  Abdomen: soft, non-tender; bowel sounds normal; no masses,  no organomegaly   Vulva:  normal  Vagina: normal vagina, no discharge, exudate, lesion, or erythema  Cervix:  multiparous appearance and no lesions  Corpus: normal size, contour, position, consistency, mobility, non-tender  Adnexa:  normal adnexa and no mass, fullness, tenderness  Rectal Exam: Not performed.         Lab Results  Component Value Date   HGB 10.3* 09/25/2015    Assessment:    Routine postpartum exam. Pap smear not done at today's visit.   H/o PE in pregnancy  Plan:   1. Contraception: vasectomy 2.  Can discontinue anticoagulant (Lovenox) therapy 3. Follow up in:  3-6 months for annual exam, or sooner as needed.    Rubie Maid, MD [facility]

## 2015-11-03 NOTE — Patient Instructions (Signed)
Can discontinue Lovenox now.

## 2016-01-14 DIAGNOSIS — D229 Melanocytic nevi, unspecified: Secondary | ICD-10-CM

## 2016-01-14 HISTORY — DX: Melanocytic nevi, unspecified: D22.9

## 2016-02-05 ENCOUNTER — Telehealth: Payer: Self-pay | Admitting: Obstetrics and Gynecology

## 2016-02-05 NOTE — Telephone Encounter (Signed)
Patient notified. She will call back when she knows her work schedule to set up an appointment.

## 2016-02-05 NOTE — Telephone Encounter (Signed)
Patient called requesting to have labs drawn for vit D, thyroid and iron. She stated her hair is thinning and falling out.Thanks

## 2016-02-05 NOTE — Telephone Encounter (Signed)
Please inform this pt that she is due for an annual exam at which time she can have these labs drawn. Pt can either see Cherry (who delivered her) or Melody (orginally a Mel pt)

## 2016-02-08 ENCOUNTER — Telehealth: Payer: Self-pay | Admitting: Obstetrics and Gynecology

## 2016-02-08 NOTE — Telephone Encounter (Signed)
Andrea Perkins looks like Dr.Cherry pt

## 2016-02-08 NOTE — Telephone Encounter (Signed)
Anxiety issues after post partum. Bad odor with discharge, frequency of urination, UTI?

## 2016-02-08 NOTE — Telephone Encounter (Signed)
As pt was told last week (please see message from 02/05/16) she needs to make an appointment with with Dr.Cherry (who delivered her) or Melody (was originally a Melody pt) for these issues.

## 2016-02-09 ENCOUNTER — Ambulatory Visit (INDEPENDENT_AMBULATORY_CARE_PROVIDER_SITE_OTHER): Payer: BLUE CROSS/BLUE SHIELD | Admitting: Obstetrics and Gynecology

## 2016-02-09 ENCOUNTER — Encounter: Payer: Self-pay | Admitting: Obstetrics and Gynecology

## 2016-02-09 VITALS — BP 114/69 | HR 90 | Ht 63.75 in | Wt 108.9 lb

## 2016-02-09 DIAGNOSIS — R399 Unspecified symptoms and signs involving the genitourinary system: Secondary | ICD-10-CM | POA: Diagnosis not present

## 2016-02-09 DIAGNOSIS — B9689 Other specified bacterial agents as the cause of diseases classified elsewhere: Secondary | ICD-10-CM

## 2016-02-09 DIAGNOSIS — N760 Acute vaginitis: Secondary | ICD-10-CM | POA: Diagnosis not present

## 2016-02-09 DIAGNOSIS — A499 Bacterial infection, unspecified: Secondary | ICD-10-CM | POA: Diagnosis not present

## 2016-02-09 DIAGNOSIS — F419 Anxiety disorder, unspecified: Secondary | ICD-10-CM

## 2016-02-09 LAB — POCT URINALYSIS DIPSTICK
BILIRUBIN UA: NEGATIVE
Blood, UA: NEGATIVE
GLUCOSE UA: NEGATIVE
KETONES UA: NEGATIVE
Nitrite, UA: NEGATIVE
PH UA: 6.5
Protein, UA: NEGATIVE
Spec Grav, UA: 1.01
Urobilinogen, UA: NEGATIVE

## 2016-02-09 MED ORDER — FLUCONAZOLE 150 MG PO TABS: 150.0000 mg | ORAL_TABLET | Freq: Once | ORAL | Status: DC

## 2016-02-09 MED ORDER — TRAZODONE HCL 50 MG PO TABS: 50.0000 mg | ORAL_TABLET | Freq: Every evening | ORAL | Status: DC | PRN

## 2016-02-09 MED ORDER — METRONIDAZOLE 500 MG PO TABS: 500.0000 mg | ORAL_TABLET | Freq: Two times a day (BID) | ORAL | Status: DC

## 2016-02-09 NOTE — Patient Instructions (Signed)

## 2016-02-09 NOTE — Progress Notes (Signed)
    GYNECOLOGY PROGRESS NOTE  Subjective:    Patient ID: Andrea Perkins, female    DOB: 1981-09-25, 35 y.o.   MRN: [id number]  HPI  Patient is a 35 y.o. G36P4004 female who presents for complaints of vaginal discharge with odor x 2 weeks. Also notes mild discomfort with urination over past several days.  In addition, patient desires to discuss anxiety, postpartum.  Denies depressive symptoms, just states that she has difficulties calming her self down, especially at night, excessive worrying about mundane details. Has difficulties sleeping at night due to ruminating thoughts. Notes at least 1-2 anxiety attacks in the past several weeks.  Denies previous h/o anxiety.  Does report h/o postpartum depression in prior pregnancy, however notes that these symptoms are different. Denies SI/HI.   The following portions of the patient's history were reviewed and updated as appropriate: allergies, current medications, past family history, past medical history, past social history, past surgical history and problem list.  Review of Systems Pertinent items noted in HPI and remainder of comprehensive ROS otherwise negative.   Objective:   Blood pressure 114/69, pulse 90, height 5' 3.75" (1.619 m), weight 108 lb 14.4 oz (49.397 kg), currently breastfeeding. General appearance: alert and no distress Abdomen: soft, non-tender; bowel sounds normal; no masses,  no organomegaly Pelvic: cervix normal in appearance, external genitalia normal, no adnexal masses or tenderness, no cervical motion tenderness, rectovaginal septum normal and uterus normal size, shape, and consistency.  Vagina with scant thin white discharge.  Extremities: extremities normal, atraumatic, no cyanosis or edema Neurologic: Grossly normal  Psychologic: normal affect, thoughts, behavior   Microscopic wet-mount exam shows clue cells. KOH done.  No yeast, trichomonads, WBCs.  Lactobacilli present.   Labs:  Results for orders placed or  performed in visit on 02/09/16  POCT urinalysis dipstick  Result Value Ref Range   Color, UA clear    Clarity, UA clear    Glucose, UA neg    Bilirubin, UA neg    Ketones, UA neg    Spec Grav, UA 1.010    Blood, UA neg    pH, UA 6.5    Protein, UA neg    Urobilinogen, UA negative    Nitrite, UA neg    Leukocytes, UA Trace (A) Negative    Assessment:   Anxiety Bacterial vaginosis UTI symptoms  Plan:   1) Anxiety - discussion had with patient regarding symptoms.  Offered psychotherapy with or without medications.  Patient notes that symptoms are mostly at night (feels calm during the day) and does not feel that psychotherapy may be beneficial due to timing.  Desires medication.  Discussed that psychotherapy could help enhance coping skills for anxiety attacks. Patient willing to consider at later time.  Will prescribe Trazodone prn qhs (is ok during breastfeeding).  RTC in 4 weeks to f/u symptoms.  2) Bacterial vaginosis to be treated with Flagyl. Also prescribed prophylactic Diflucan in case of yeast vaginitis from antibiotic use.    3) UA negative today for UTI. May likely be bladder irritation from BV infection.    Rubie Maid, MD [facility]

## 2016-02-10 LAB — URINE CULTURE: ORGANISM ID, BACTERIA: NO GROWTH

## 2016-02-24 ENCOUNTER — Telehealth: Payer: Self-pay | Admitting: Obstetrics and Gynecology

## 2016-02-24 NOTE — Telephone Encounter (Signed)
Pt called and she started on an anxiety medicine and she wanted to know if she could change it to something else, she stated that the meds that you but her on is helping her sleep at night but not helping with the anxiety during the day.

## 2016-02-25 MED ORDER — PAROXETINE HCL 20 MG PO TABS: 20.0000 mg | ORAL_TABLET | Freq: Every day | ORAL | Status: DC

## 2016-02-25 NOTE — Telephone Encounter (Signed)
Pt aware.

## 2016-02-25 NOTE — Telephone Encounter (Signed)
Yes, please inform her that I can switch her to Paxil.  I will send it to her pharmacy. If the Paxil is not helping with sleeping at night, she can also still use the Trazadone at night.

## 2016-03-17 ENCOUNTER — Ambulatory Visit (INDEPENDENT_AMBULATORY_CARE_PROVIDER_SITE_OTHER): Payer: BLUE CROSS/BLUE SHIELD | Admitting: Obstetrics and Gynecology

## 2016-03-17 ENCOUNTER — Encounter: Payer: Self-pay | Admitting: Obstetrics and Gynecology

## 2016-03-17 VITALS — BP 74/52 | HR 88 | Ht 64.0 in | Wt 105.6 lb

## 2016-03-17 DIAGNOSIS — N912 Amenorrhea, unspecified: Secondary | ICD-10-CM | POA: Diagnosis not present

## 2016-03-17 DIAGNOSIS — Z01419 Encounter for gynecological examination (general) (routine) without abnormal findings: Secondary | ICD-10-CM

## 2016-03-17 NOTE — Progress Notes (Signed)
Subjective:   Andrea Perkins is a 35 y.o. G37P4004 Caucasian female here for a routine well-woman exam.  No LMP recorded.    Current complaints: none PCP: ?       doesn't desire labs  Social History: Sexual: heterosexual Marital Status: married Living situation: with family Occupation: homemaker Tobacco/alcohol: no tobacco use Illicit drugs: no history of illicit drug use  The following portions of the patient's history were reviewed and updated as appropriate: allergies, current medications, past family history, past medical history, past social history, past surgical history and problem list.  Past Medical History Past Medical History  Diagnosis Date  . Chicken pox   . Pulmonary embolism affecting pregnancy 3/16  . Pulmonary embolism affecting pregnancy 4/16  . H/O pulmonary embolus during pregnancy 09/22/2015    Past Surgical History Past Surgical History  Procedure Laterality Date  . Wisdom tooth extraction    . Wisdom tooth extraction    . Vaginal delivery      x 3    Gynecologic History [id number]  No LMP recorded. Contraception: condoms Last Pap: 2015. Results were: normal   Obstetric History OB History  Gravida Para Term Preterm AB SAB TAB Ectopic Multiple Living  4 4 4       0 4    # Outcome Date GA Lbr Len/2nd Weight Sex Delivery Anes PTL Lv  4 Term 09/24/15 [redacted]w[redacted]d / 00:25 8 lb 11.3 oz (3.95 kg) M Vag-Spont EPI  Y  3 Term 10/11/10   8 lb 6 oz (3.799 kg) F Vag-Spont  N Y  2 Term 02/08/08   8 lb 11 oz (3.941 kg) M Vag-Spont   Y  1 Term 06/11/06   8 lb (3.629 kg) M Vag-Spont EPI  Y      Current Medications Current Outpatient Prescriptions on File Prior to Visit  Medication Sig Dispense Refill  . traZODone (DESYREL) 50 MG tablet Take 1 tablet (50 mg total) by mouth at bedtime as needed for sleep. 30 tablet 2  . Biotin 1000 MCG tablet Take 1,000 mcg by mouth 3 (three) times daily. Reported on 03/17/2016    . fluconazole (DIFLUCAN) 150 MG tablet Take 1 tablet  (150 mg total) by mouth once. (Patient not taking: Reported on 03/17/2016) 1 tablet 0  . metroNIDAZOLE (FLAGYL) 500 MG tablet Take 1 tablet (500 mg total) by mouth 2 (two) times daily. (Patient not taking: Reported on 03/17/2016) 14 tablet 0  . PARoxetine (PAXIL) 20 MG tablet Take 1 tablet (20 mg total) by mouth daily. If no response in 2 weeks, can increase to 1.5 tablets daily. (Patient not taking: Reported on 03/17/2016) 30 tablet 6  . Prenatal Vit-Fe Fumarate-FA (PRENATAL ONE DAILY PO) Take 1 capsule by mouth daily. Reported on 03/17/2016     No current facility-administered medications on file prior to visit.    Review of Systems Patient denies any headaches, blurred vision, shortness of breath, chest pain, abdominal pain, problems with bowel movements, urination, or intercourse.  Objective:  BP 74/52 mmHg  Pulse 88  Ht 5\' 4"  (1.626 m)  Wt 105 lb 9.6 oz (47.9 kg)  BMI 18.12 kg/m2  Breastfeeding? Yes Physical Exam  General:  Well developed, well nourished, no acute distress. She is alert and oriented x3. Skin:  Warm and dry Neck:  Midline trachea, no thyromegaly or nodules Cardiovascular: Regular rate and rhythm, no murmur heard Lungs:  Effort normal, all lung fields clear to auscultation bilaterally Breasts:  No dominant palpable mass, retraction, or  nipple discharge Abdomen:  Soft, non tender, no hepatosplenomegaly or masses Pelvic:  External genitalia is normal in appearance.  The vagina is normal in appearance. The cervix is bulbous, no CMT, off to right.  Thin prep pap is done with HR HPV cotesting. Uterus is felt to be normal size, shape, and contour.  No adnexal masses or tenderness noted.  Extremities:  No swelling or varicosities noted Psych:  She has a normal mood and affect  Assessment:   Healthy well-woman exam Amenorrhea secondary to breastfeeding Anxiety and sleep disturbance- trazadone working well  Plan:    F/U 1 year for AE, or sooner if needed  Abbegale Stehle Rockney Ghee, CNM

## 2016-03-17 NOTE — Patient Instructions (Signed)
  Place annual gynecologic exam patient instructions here.  Thank you for enrolling in [facility]. Please follow the instructions below to securely access your online medical record. MyChart allows you to send messages to your doctor, view your test results, manage appointments, and more.   How Do I Sign Up? 1. In your Internet browser, go to [employer] and enter [email or web address]. 2. Click on the Sign Up Now link in the Sign In box. You will see the New Member Sign Up page. 3. Enter your MyChart Access Code exactly as it appears below. You will not need to use this code after you've completed the sign-up process. If you do not sign up before the expiration date, you must request a new code.  MyChart Access Code: [email or web address] Expires: 04/05/2016 10:19 AM  4. Enter your Social Security Number ([id number]) and Date of Birth (mm/dd/yyyy) as indicated and click Submit. You will be taken to the next sign-up page. 5. Create a MyChart ID. This will be your MyChart login ID and cannot be changed, so think of one that is secure and easy to remember. 6. Create a MyChart password. You can change your password at any time. 7. Enter your Password Reset Question and Answer. This can be used at a later time if you forget your password.  8. Enter your e-mail address. You will receive e-mail notification when new information is available in [place]. 9. Click Sign Up. You can now view your medical record.   Additional Information Remember, MyChart is NOT to be used for urgent needs. For medical emergencies, dial 911.

## 2016-03-21 ENCOUNTER — Other Ambulatory Visit: Payer: Self-pay | Admitting: Obstetrics and Gynecology

## 2016-03-22 LAB — CYTOLOGY - PAP

## 2016-03-28 ENCOUNTER — Telehealth: Payer: Self-pay

## 2016-03-28 NOTE — Telephone Encounter (Signed)
-----   Message from Rubie Maid, MD sent at 03/28/2016 11:51 AM EDT ----- Please inform of normal pap smear.

## 2016-03-28 NOTE — Telephone Encounter (Signed)
Called pt informed her of information below.  

## 2016-05-29 IMAGING — CT CT ANGIO CHEST
2 of 6 series · 18 of 36 positions shown · IV contrast (APPLIED)
Comparison: Chest radiograph performed earlier today at [DATE] a.m.

CLINICAL DATA: Acute onset of right-sided chest pain, worse with
deep breaths. Initial encounter.

EXAM:
CT ANGIOGRAPHY CHEST WITH CONTRAST
TECHNIQUE: Multidetector CT imaging of the chest was performed using the
standard protocol during bolus administration of intravenous
contrast. Multiplanar CT image reconstructions and MIPs were
obtained to evaluate the vascular anatomy.
CONTRAST:  75 mL of Omnipaque 350 IV contrast

[Series 7: pe 1.0 thins · axial · 0.56mm/px · z∈[-48,+167]mm · 17 of 243 slices shown]
[im 14/243  lung]
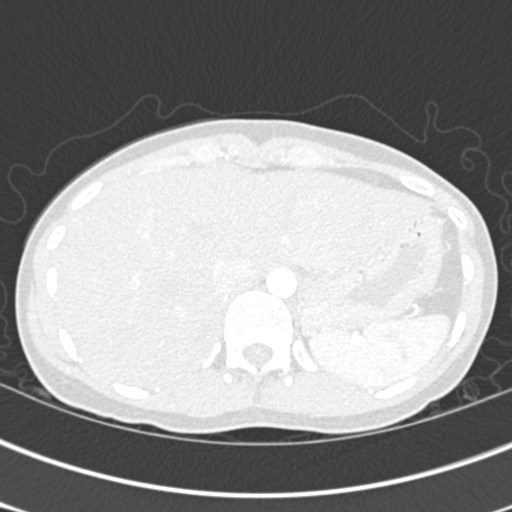
[im 27/243  mediastinal]
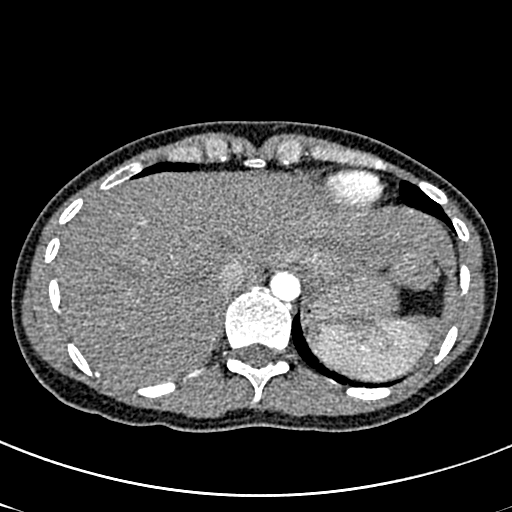
[im 41/243  lung]
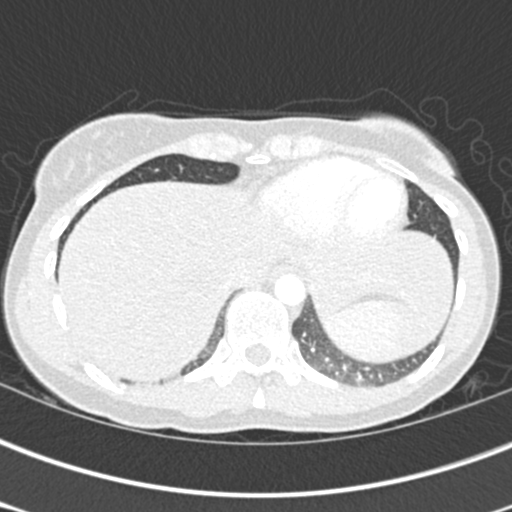
[im 54/243  mediastinal]
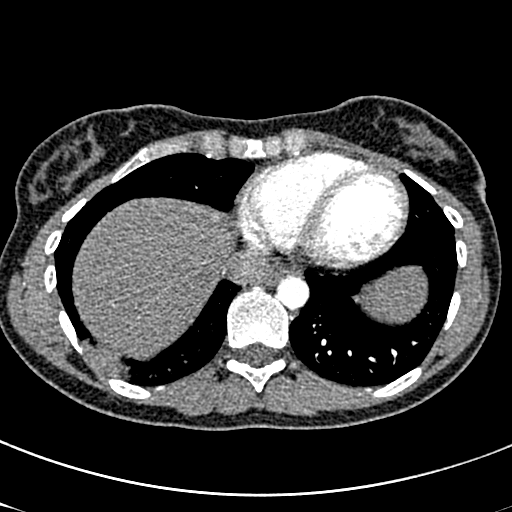
[im 68/243  lung]
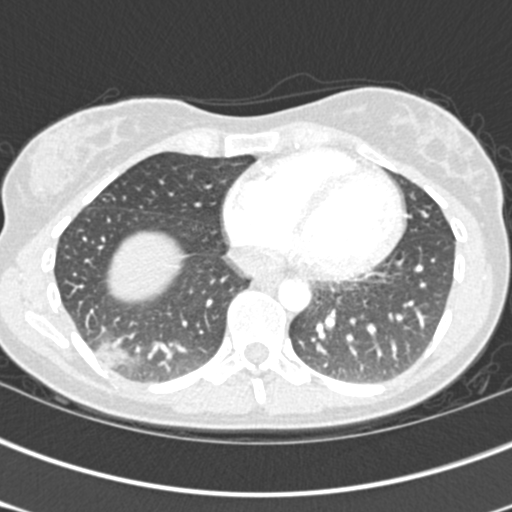
[im 81/243  mediastinal]
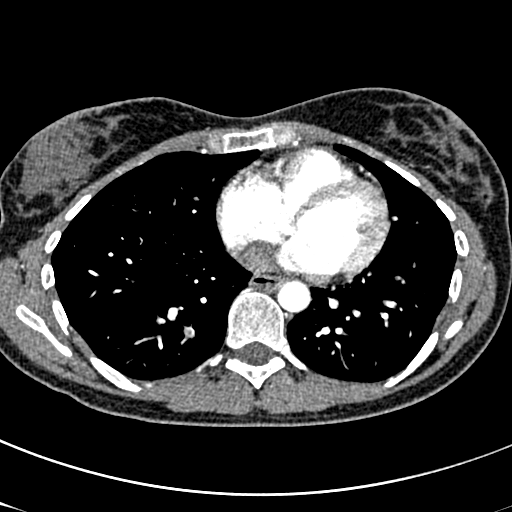
[im 95/243  lung]
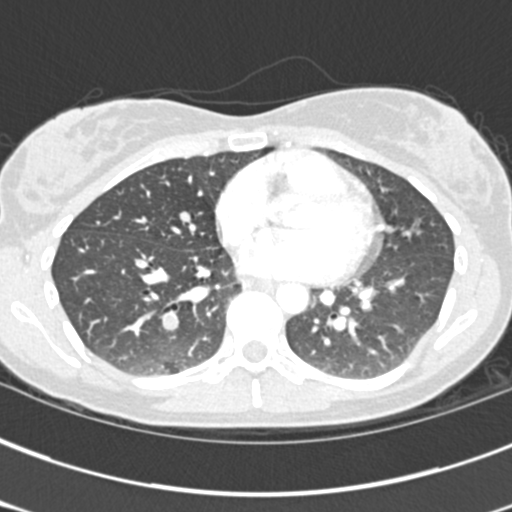
[im 108/243  mediastinal]
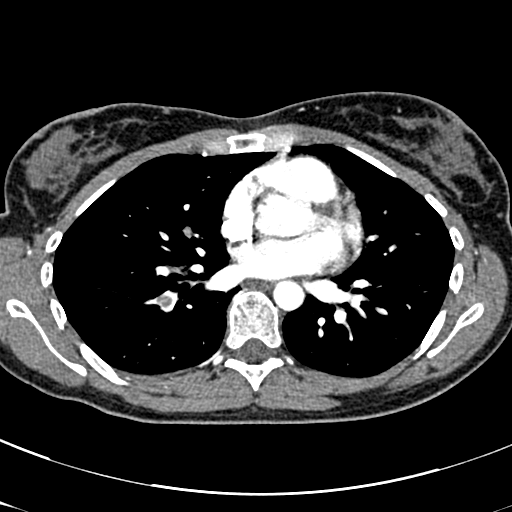
[im 122/243  lung]
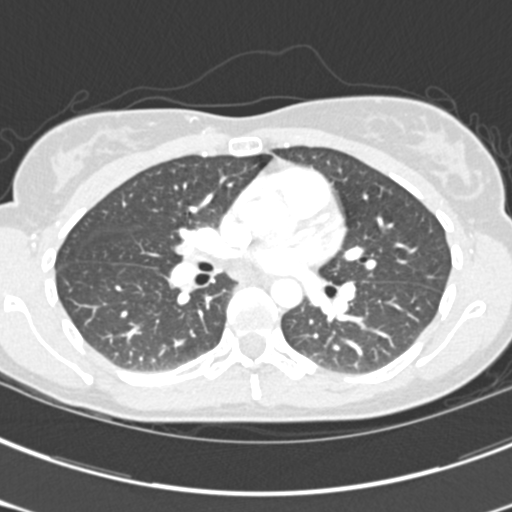
[im 135/243  mediastinal]
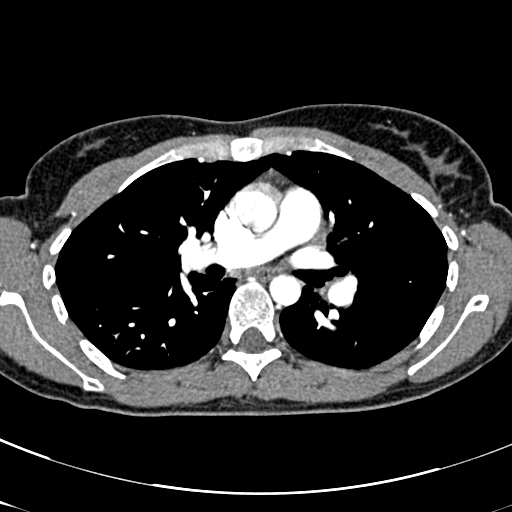
[im 148/243  lung]
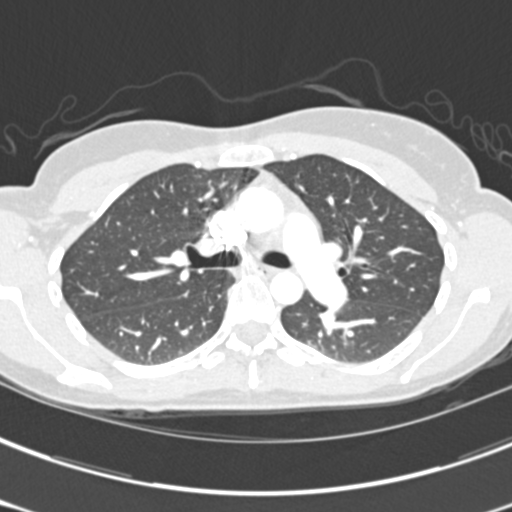
[im 162/243  mediastinal]
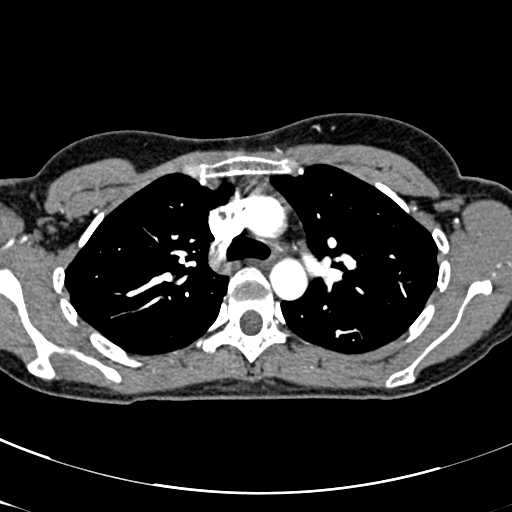
[im 175/243  lung]
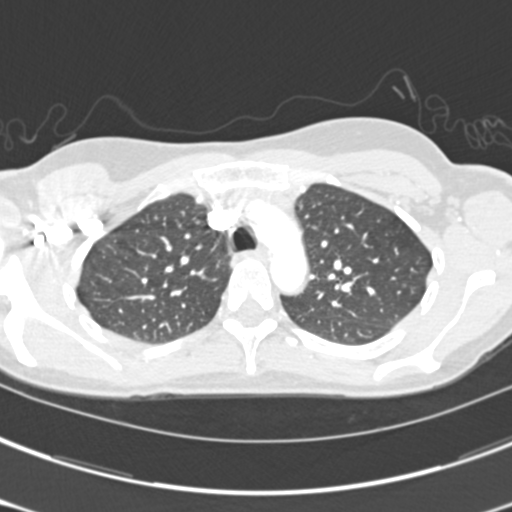
[im 189/243  mediastinal]
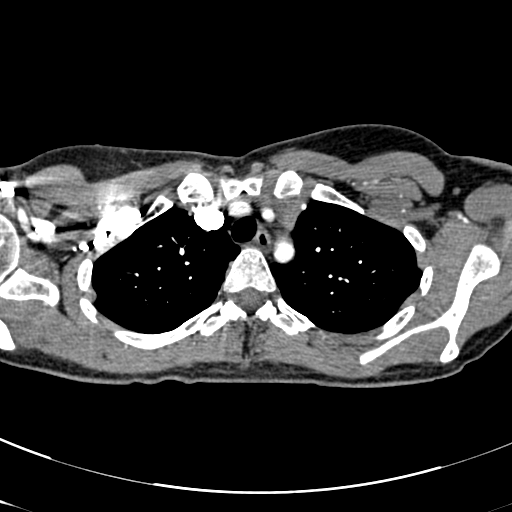
[im 202/243  lung]
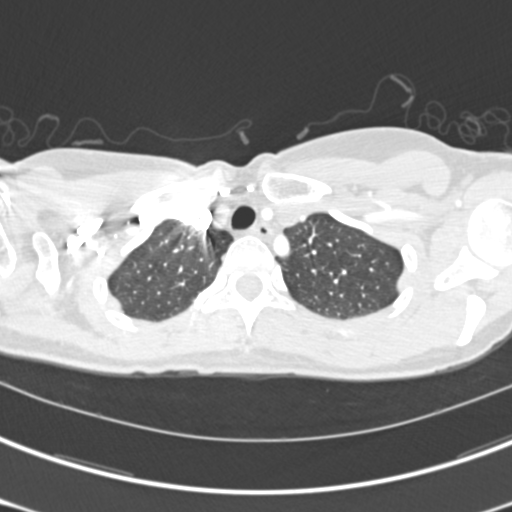
[im 216/243  mediastinal]
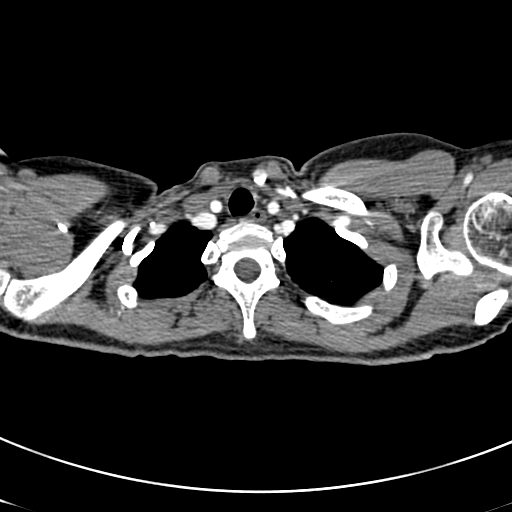
[im 229/243  lung]
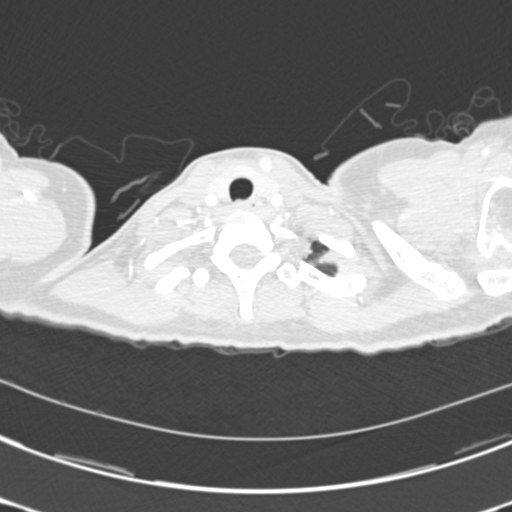

[Series 9: cor pe 2.0 mpr · coronal · 0.47mm/px · 1 of 84 slices shown]
[im 42/84  mediastinal]
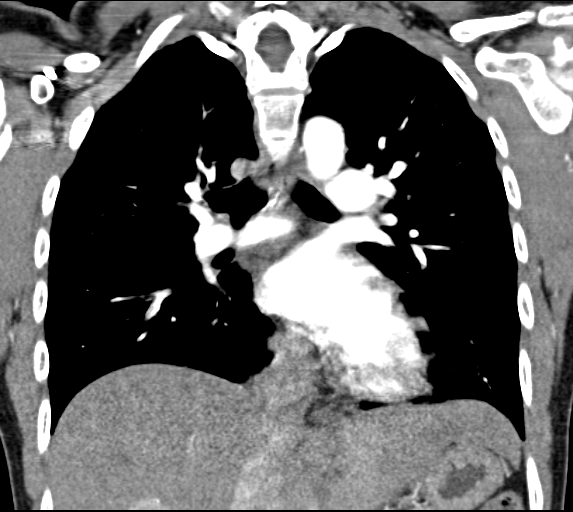

[18 of 36 positions shown; findings below may reference images not displayed]

FINDINGS: Pulmonary embolus is noted within pulmonary arteries to the right
middle and lower lung lobes, extending distally into subsegmental
branches. Moderate clot burden is noted. The RV/LV ratio is 0.76.

A small focal pulmonary infarct is noted at the right lung base.
Minimal atelectasis is noted within the medial right upper lobe. The
left lung appears clear. There is no evidence of pleural effusion or
pneumothorax. No masses are identified; no abnormal focal contrast
enhancement is seen.

The mediastinum is unremarkable in appearance. No mediastinal
lymphadenopathy is seen. No pericardial effusion is identified. The
great vessels are grossly unremarkable. No axillary lymphadenopathy
is seen. The visualized portions of the thyroid gland are
unremarkable in appearance.

The visualized portions of the liver and spleen are unremarkable.

No acute osseous abnormalities are seen.

Review of the MIP images confirms the above findings.
IMPRESSION: 1. Pulmonary embolus within the pulmonary arteries to the right
middle and lower lung lobes, extending distally into subsegmental
branches. Moderate clot burden noted. RV/LV ratio remains within
normal limits at 0.76.
2. Small focal pulmonary infarct at the right lung base. Minimal
atelectasis within the medial right upper lobe.

Critical Value/emergent results were called by telephone at the time
of interpretation on 02/28/2015 at [DATE] to Dr. Sawiyah, who verbally
acknowledged these results.

## 2016-05-29 IMAGING — CR DG CHEST 1V PORT
1 series · 1 of 1 positions shown · non-contrast
Comparison: None.

CLINICAL DATA: Acute onset of right rib and back pain for 1 day.
Initial encounter.

EXAM:
PORTABLE CHEST - 1 VIEW

[ap]
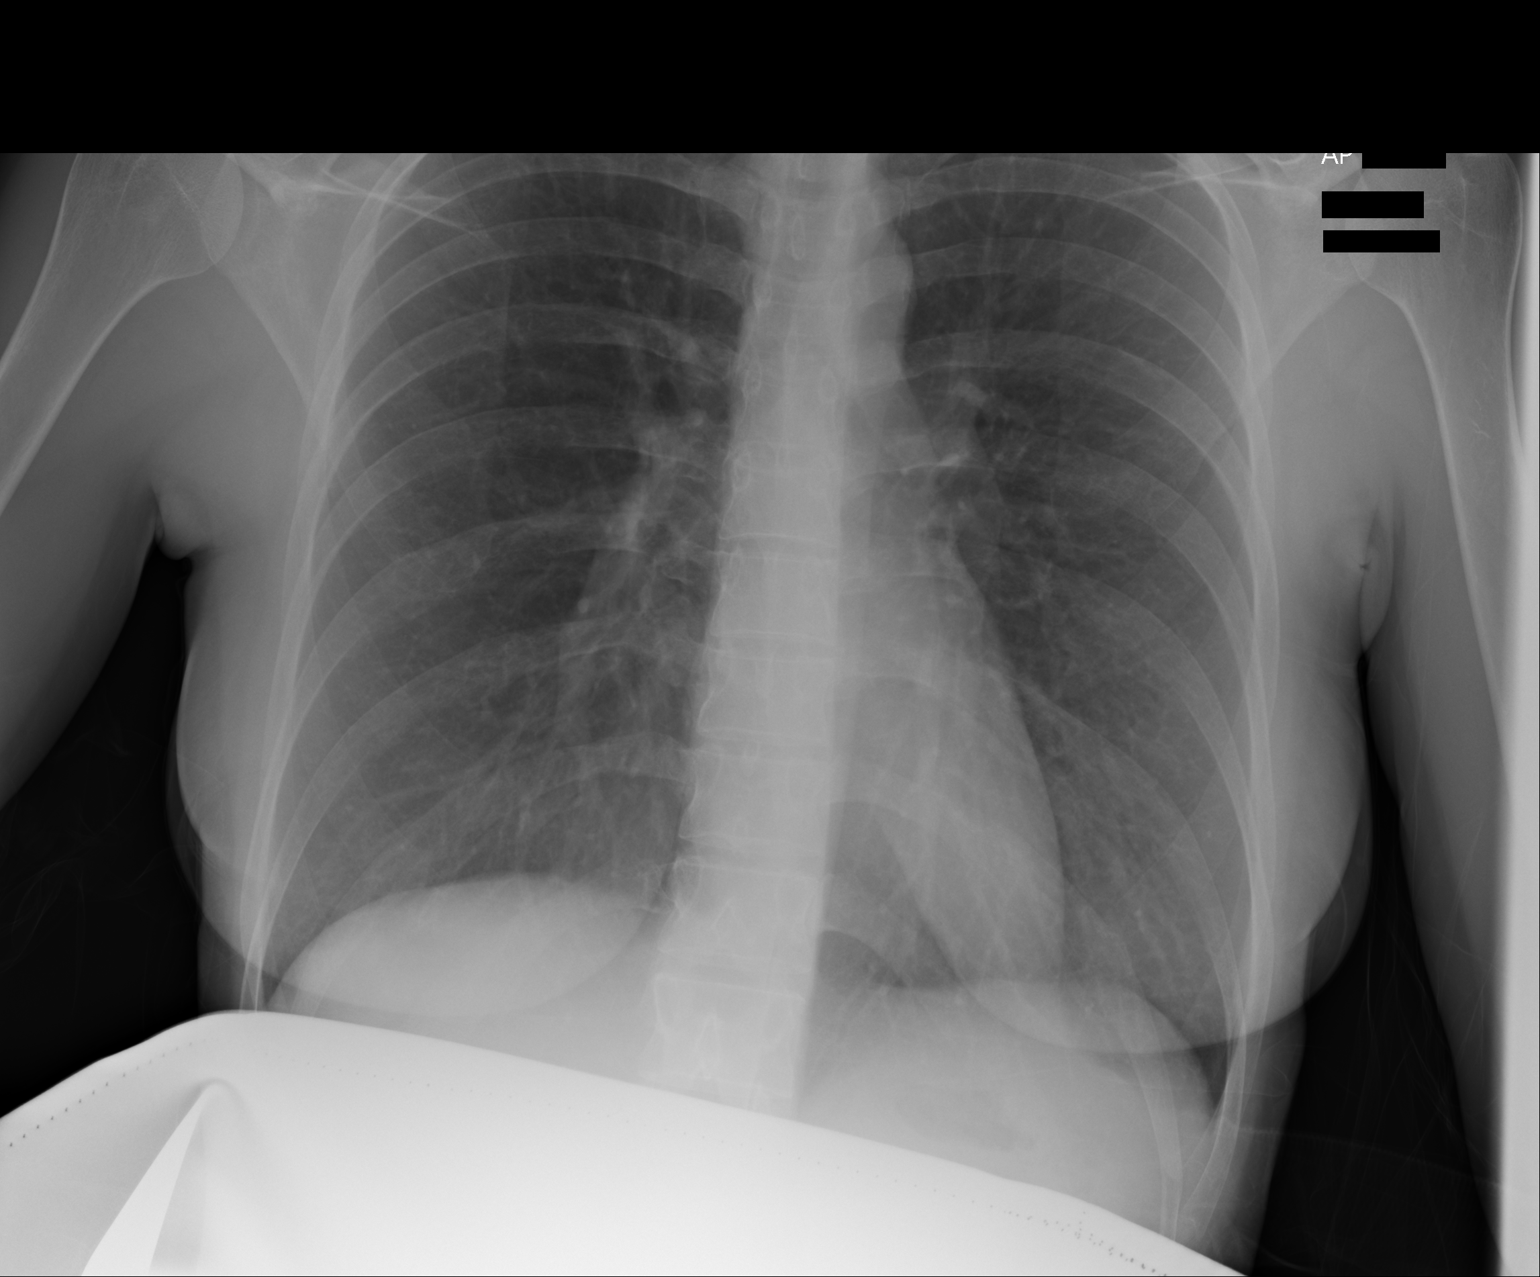

[1 of 1 positions shown; findings below may reference images not displayed]

FINDINGS: The lungs are well-aerated and clear. There is no evidence of focal
opacification, pleural effusion or pneumothorax.

The cardiomediastinal silhouette is within normal limits. No acute
osseous abnormalities are seen.
IMPRESSION: No acute cardiopulmonary process seen. No displaced rib fractures
identified.

## 2016-06-09 ENCOUNTER — Encounter: Payer: Self-pay | Admitting: Family Medicine

## 2016-06-09 ENCOUNTER — Ambulatory Visit (INDEPENDENT_AMBULATORY_CARE_PROVIDER_SITE_OTHER): Payer: BLUE CROSS/BLUE SHIELD | Admitting: Family Medicine

## 2016-06-09 VITALS — BP 92/58 | HR 69 | Temp 97.4°F | Wt 106.8 lb

## 2016-06-09 DIAGNOSIS — L299 Pruritus, unspecified: Secondary | ICD-10-CM | POA: Diagnosis not present

## 2016-06-09 NOTE — Patient Instructions (Signed)
Ask to be put on the cancellation list in the meantime at the allergy clinic and use topical benadryl cream as needed for now.  Take care.  Glad to see you.

## 2016-06-09 NOTE — Progress Notes (Signed)
Pre visit review using our clinic review tool, if applicable. No additional management support is needed unless otherwise documented below in the visit note.  Started about 2 months ago.  Some itching around her lips.  Resolved when she was at the beach.  Back in the meantime, on return to home.  Now with periorbital irritation and puffiness.  Itchy.  No help with zyrtec and claritin- that had helped in years past with allergy sx.  B facial sx.  No FCNAVD.  No URI sx.  No new make up, rarely wears it.  No one else with similar sx.  No ear pain.  No vision changes.  No wheeze.  No rash or itching o/w.  Itching is more noted than the rash.  No new meds.  No triggers o/w.    Has appointment with Dr. Orvil Feil for this fall.   Meds, vitals, and allergies reviewed.   ROS: Per HPI unless specifically indicated in ROS section   nad ncat Tm wnl Nasal and OP exam wnl Minimal faint irritation noted near the lips, slightly puffy under both lower eye lids (mild allergic shiners) Neck supple, no LA rrr ctab

## 2016-06-10 DIAGNOSIS — L299 Pruritus, unspecified: Secondary | ICD-10-CM | POA: Insufficient documentation

## 2016-06-10 NOTE — Assessment & Plan Note (Signed)
Doesn't look typical for perioral dermatitis, d/w pt.  Breastfeeding.  Would try local topical bendaryl cream in the meantime and update [place] as needed.  Presumed to be allergic but not angioedema.   Okay for outpatient f/u.  See AVS.

## 2016-07-07 ENCOUNTER — Telehealth: Payer: Self-pay | Admitting: Obstetrics and Gynecology

## 2016-07-07 NOTE — Telephone Encounter (Signed)
Dr Marcelline Mates- would you please advise me on this, thanks

## 2016-07-07 NOTE — Telephone Encounter (Signed)
Yes.  She can be prescribed Hydroxyzine 50 mg tablets (can take 1-2 tablets at night ~ 30-60 min prior to bedtime).  Is safe during breastfeeding.

## 2016-07-07 NOTE — Telephone Encounter (Signed)
Taking Paxil, and she cant sleep, she doesn't want to take it in the morn b/c she is nursing. Is there something else she can take since she is nursing her baby.

## 2016-07-18 ENCOUNTER — Telehealth: Payer: Self-pay | Admitting: Internal Medicine

## 2016-07-18 ENCOUNTER — Encounter: Payer: Self-pay | Admitting: Internal Medicine

## 2016-07-18 ENCOUNTER — Other Ambulatory Visit: Payer: Self-pay | Admitting: *Deleted

## 2016-07-18 ENCOUNTER — Ambulatory Visit (INDEPENDENT_AMBULATORY_CARE_PROVIDER_SITE_OTHER): Payer: BLUE CROSS/BLUE SHIELD | Admitting: Internal Medicine

## 2016-07-18 DIAGNOSIS — R21 Rash and other nonspecific skin eruption: Secondary | ICD-10-CM | POA: Insufficient documentation

## 2016-07-18 LAB — COMPREHENSIVE METABOLIC PANEL
ALT: 10 U/L (ref 0–35)
AST: 13 U/L (ref 0–37)
Albumin: 4.7 g/dL (ref 3.5–5.2)
Alkaline Phosphatase: 89 U/L (ref 39–117)
BUN: 8 mg/dL (ref 6–23)
CALCIUM: 10.1 mg/dL (ref 8.4–10.5)
CHLORIDE: 105 meq/L (ref 96–112)
CO2: 28 meq/L (ref 19–32)
Creatinine, Ser: 0.85 mg/dL (ref 0.40–1.20)
GFR: 81.02 mL/min (ref >60.00)
Glucose, Bld: 77 mg/dL (ref 70–99)
Potassium: 4.4 mEq/L (ref 3.5–5.1)
Sodium: 140 mEq/L (ref 135–145)
Total Bilirubin: 0.5 mg/dL (ref 0.2–1.2)
Total Protein: 7.5 g/dL (ref 6.0–8.3)

## 2016-07-18 LAB — CBC WITH DIFFERENTIAL/PLATELET
BASOS PCT: 0.8 % (ref 0.0–3.0)
Basophils Absolute: 0 10*3/uL (ref 0.0–0.1)
Eosinophils Absolute: 0.2 10*3/uL (ref 0.0–0.7)
Eosinophils Relative: 4.5 % (ref 0.0–5.0)
HEMATOCRIT: 42.1 % (ref 36.0–46.0)
Hemoglobin: 14.3 g/dL (ref 12.0–15.0)
LYMPHS PCT: 34.9 % (ref 12.0–46.0)
Lymphs Abs: 1.8 10*3/uL (ref 0.7–4.0)
MCHC: 34 g/dL (ref 30.0–36.0)
MCV: 88.6 fl (ref 78.0–100.0)
MONOS PCT: 8.7 % (ref 3.0–12.0)
Monocytes Absolute: 0.4 10*3/uL (ref 0.1–1.0)
NEUTROS ABS: 2.6 10*3/uL (ref 1.4–7.7)
Neutrophils Relative %: 51.1 % (ref 43.0–77.0)
PLATELETS: 371 10*3/uL (ref 150.0–400.0)
RBC: 4.75 Mil/uL (ref 3.87–5.11)
RDW: 13 % (ref 11.5–15.5)
WBC: 5.1 10*3/uL (ref 4.0–10.5)

## 2016-07-18 LAB — T4, FREE: Free T4: 0.75 ng/dL (ref 0.60–1.60)

## 2016-07-18 LAB — SEDIMENTATION RATE: Sed Rate: 7 mm/hr (ref 0–20)

## 2016-07-18 MED ORDER — HYDROXYZINE PAMOATE 50 MG PO CAPS: 50.0000 mg | ORAL_CAPSULE | Freq: Four times a day (QID) | ORAL | 0 refills | Status: DC | PRN

## 2016-07-18 NOTE — Progress Notes (Signed)
Pre visit review using our clinic review tool, if applicable. No additional management support is needed unless otherwise documented below in the visit note. 

## 2016-07-18 NOTE — Telephone Encounter (Signed)
Pt has appt with Dr Silvio Pate on 07/18/16 at 11:15

## 2016-07-18 NOTE — Progress Notes (Signed)
Subjective:    Patient ID: Andrea Perkins, female    DOB: [birth year], 35 y.o.   MRN: [id number]  HPI Here due to eye swelling For past 3 months has had perioral and facial itching--some swelling as well Really bad in past month--now eyes swollen also Did see allergist Had prick testing---didn't find anything (even milk--but she does get congested with this) xyzal and benedryl but also tried claritin and zyrtec. None seem to help  Post partum anxiety Tried trazodone which helped a little for a while Now on paroxetine for 7 days  Nothing else is new Nursing but not on vitamins No new pillows, linens  Seemed to get better after sun exposure during trips to beach  No current outpatient prescriptions on file prior to visit.   No current facility-administered medications on file prior to visit.     Allergies  Allergen Reactions  . Amoxicillin Anaphylaxis and Rash    this medication makes pt's ears itch.  . Amoxicillin Itching  . Lactulose Other (See Comments)  . Milk-Related Compounds Hives    Along with congestion    Past Medical History:  Diagnosis Date  . Chicken pox   . H/O pulmonary embolus during pregnancy 09/22/2015  . Pulmonary embolism affecting pregnancy 3/16  . Pulmonary embolism affecting pregnancy 4/16    Past Surgical History:  Procedure Laterality Date  . VAGINAL DELIVERY     x 3  . WISDOM TOOTH EXTRACTION    . WISDOM TOOTH EXTRACTION      Family History  Problem Relation Age of Onset  . Hypertension Mother   . Hyperlipidemia Mother   . Cancer Mother 66s    Breast  . Thyroid disease Sister   . Alcohol abuse Brother   . Drug abuse Brother   . Cancer Brother     "pleural" cancer  . Alcohol abuse Maternal Grandfather   . Drug abuse Maternal Grandfather   . Mental illness Maternal Grandfather     ?bipolar    Social History   Social History  . Marital status: Married    Spouse name: N/A  . Number of children: 3  . Years of education:  N/A   Occupational History  . Stay at home mom    Social History Main Topics  . Smoking status: Never Smoker  . Smokeless tobacco: Never Used  . Alcohol use No  . Drug use: No  . Sexual activity: Yes    Birth control/ protection: Condom     Comment: Pregnant   Other Topics Concern  . Not on file   Social History Narrative   Husband teaches finance at [employer] Just started her periods yesterday--and had worse facial swelling Weight is normal for her Appetite is good No fevers No joint swelling No mouth swelling or sores    Objective:   Physical Exam  Constitutional: She appears well-developed and well-nourished. No distress.  HENT:  Mouth/Throat: Oropharynx is clear and moist. No oropharyngeal exudate.  Redness in face ---periorbital and perioral--without clear lip swelling Mouth is normal  Eyes: Conjunctivae are normal.  Neck: Normal range of motion. Neck supple. No thyromegaly present.  Cardiovascular: Normal rate, regular rhythm and normal heart sounds.  Exam reveals no gallop.   No murmur heard. Pulmonary/Chest: Effort normal. No respiratory distress. She has no wheezes. She has no rales.  Abdominal: Soft. There is no tenderness.  No HSM  Musculoskeletal:  No synovitis  Lymphadenopathy:    She has no  cervical adenopathy.  Skin:  No other rash  Psychiatric: She has a normal mood and affect. Her behavior is normal.          Assessment & Plan:

## 2016-07-18 NOTE — Telephone Encounter (Signed)
[facility]  Patient Name: Andrea Perkins  DOB: 1981-06-24    Initial Comment Caller states c/o eye swelling and her face is itching.   Nurse Assessment  Nurse: Wynetta Emery, RN, Baker Janus Date/Time Eilene Ghazi Time): 07/18/2016 8:33:18 AM  Confirm and document reason for call. If symptomatic, describe symptoms. You must click the next button to save text entered. ---[name] started yesterday am with puffiness of both eyes itching and itching of face x 3 months not eyes involved.  Has the patient traveled out of the country within the last 30 days? ---No  Does the patient have any new or worsening symptoms? ---Yes  Will a triage be completed? ---Yes  Related visit to physician within the last 2 weeks? ---No  Does the PT have any chronic conditions? (i.e. diabetes, asthma, etc.) ---No  Is the patient pregnant or possibly pregnant? (Ask all females between the ages of 67-55) ---No  Is this a behavioral health or substance abuse call? ---No     Guidelines    Guideline Title Affirmed Question Affirmed Notes  Itching - Localized [1] MODERATE-SEVERE local itching (i.e., interferes with work, school, activities) AND [2] not improved after 24 hours of hydrocortisone cream    Final Disposition User   See Physician within 24 Hours [place], RN, Baker Janus    Comments  NOTE patient made appt with Dr. Silvio Pate for 11am today   Referrals  REFERRED TO PCP OFFICE   Disagree/Comply: Comply

## 2016-07-18 NOTE — Telephone Encounter (Signed)
Will evaluate at OV 

## 2016-07-18 NOTE — Assessment & Plan Note (Signed)
With prominent itching Now worse No clear inciting factors Aggressive antihistamines not really helping No symptoms/signs of collagen vascular disorder Will check some labs Probably needs derm as next step--has appt

## 2016-07-19 ENCOUNTER — Encounter: Payer: Self-pay | Admitting: Internal Medicine

## 2016-07-19 DIAGNOSIS — R768 Other specified abnormal immunological findings in serum: Secondary | ICD-10-CM

## 2016-07-19 LAB — ANA W/REFLEX IF POSITIVE
Anti JO-1: <0.2 AI (ref 0.0–0.9)
Anti Nuclear Antibody(ANA): POSITIVE — AB
Centromere Ab Screen: <0.2 AI (ref 0.0–0.9)
Chromatin Ab SerPl-aCnc: <0.2 AI (ref 0.0–0.9)
ENA RNP Ab: 0.2 AI (ref 0.0–0.9)
ENA SM Ab Ser-aCnc: <0.2 AI (ref 0.0–0.9)
ENA SSA (RO) Ab: 3.3 AI — ABNORMAL HIGH (ref 0.0–0.9)
ENA SSB (LA) Ab: 0.2 AI (ref 0.0–0.9)
Scleroderma SCL-70: 0.2 AI (ref 0.0–0.9)
dsDNA Ab: 11 [IU]/mL — ABNORMAL HIGH (ref 0–9)

## 2016-07-21 ENCOUNTER — Telehealth: Payer: Self-pay | Admitting: Internal Medicine

## 2016-07-21 NOTE — Telephone Encounter (Signed)
error 

## 2016-09-01 ENCOUNTER — Encounter: Payer: Self-pay | Admitting: Obstetrics and Gynecology

## 2016-09-01 ENCOUNTER — Ambulatory Visit (INDEPENDENT_AMBULATORY_CARE_PROVIDER_SITE_OTHER): Payer: BLUE CROSS/BLUE SHIELD | Admitting: Obstetrics and Gynecology

## 2016-09-01 VITALS — BP 96/67 | HR 76 | Ht 64.0 in | Wt 104.0 lb

## 2016-09-01 DIAGNOSIS — N898 Other specified noninflammatory disorders of vagina: Secondary | ICD-10-CM

## 2016-09-01 MED ORDER — HYLAFEM VA SUPP: 1.0000 | VAGINAL | 0 refills | Status: DC | PRN

## 2016-09-01 NOTE — Progress Notes (Signed)
Subjective:     Patient ID: Andrea Perkins, female   DOB: [birth year], 35 y.o.   MRN: [id number]  HPI Vaginal discharge with odor for a few weeks, denies abnormal bleeding, vaginal irritation or postcoital spotting. Denies bowel or bladder changes.  Review of Systems Negative except above stated HPI    Objective:   Physical Exam A&O x4 Well groomed female in no distress Blood pressure 96/67, pulse 76, height 5\' 4"  (1.626 m), weight 104 lb (47.2 kg), last menstrual period 08/16/2016, currently breastfeeding. Pelvic exam: normal external genitalia, vulva, vagina, cervix, uterus and adnexa, CERVIX: cervical discharge present - clear, white and copious, WET MOUNT done - results: negative for pathogens, normal epithelial cells, KOH done, vaginal pH is 6.    Assessment:     leukhorrhea     Plan:     Hylafem sample given, counseled on common reasons for changes in vaginal pH and odor resulting from it. Will call if needs rx for hylafem. RTC as needed.  Ladina Shutters Irwin, CNM

## 2016-09-02 ENCOUNTER — Other Ambulatory Visit: Payer: Self-pay

## 2016-09-02 DIAGNOSIS — F419 Anxiety disorder, unspecified: Secondary | ICD-10-CM

## 2016-09-02 MED ORDER — PAROXETINE HCL 20 MG PO TABS: 20.0000 mg | ORAL_TABLET | Freq: Every day | ORAL | 3 refills | Status: DC

## 2016-09-20 ENCOUNTER — Ambulatory Visit: Payer: Self-pay

## 2017-01-16 DIAGNOSIS — D485 Neoplasm of uncertain behavior of skin: Secondary | ICD-10-CM | POA: Diagnosis not present

## 2017-01-16 DIAGNOSIS — Z808 Family history of malignant neoplasm of other organs or systems: Secondary | ICD-10-CM | POA: Diagnosis not present

## 2017-01-16 DIAGNOSIS — Z1283 Encounter for screening for malignant neoplasm of skin: Secondary | ICD-10-CM | POA: Diagnosis not present

## 2017-01-16 DIAGNOSIS — L821 Other seborrheic keratosis: Secondary | ICD-10-CM | POA: Diagnosis not present

## 2017-02-07 DIAGNOSIS — R21 Rash and other nonspecific skin eruption: Secondary | ICD-10-CM | POA: Diagnosis not present

## 2017-02-07 DIAGNOSIS — R768 Other specified abnormal immunological findings in serum: Secondary | ICD-10-CM | POA: Diagnosis not present

## 2017-04-14 DIAGNOSIS — D229 Melanocytic nevi, unspecified: Secondary | ICD-10-CM | POA: Diagnosis not present

## 2017-04-14 DIAGNOSIS — L814 Other melanin hyperpigmentation: Secondary | ICD-10-CM | POA: Diagnosis not present

## 2017-04-14 DIAGNOSIS — L700 Acne vulgaris: Secondary | ICD-10-CM | POA: Diagnosis not present

## 2018-10-12 ENCOUNTER — Ambulatory Visit (INDEPENDENT_AMBULATORY_CARE_PROVIDER_SITE_OTHER): Payer: BLUE CROSS/BLUE SHIELD | Admitting: Obstetrics and Gynecology

## 2018-10-12 ENCOUNTER — Other Ambulatory Visit (HOSPITAL_COMMUNITY)
Admission: RE | Admit: 2018-10-12 | Discharge: 2018-10-12 | Disposition: A | Discharge status: Home / Self Care | Payer: BLUE CROSS/BLUE SHIELD | Source: Ambulatory Visit | Attending: Obstetrics and Gynecology | Admitting: Obstetrics and Gynecology

## 2018-10-12 ENCOUNTER — Encounter: Payer: Self-pay | Admitting: Obstetrics and Gynecology

## 2018-10-12 VITALS — BP 114/68 | HR 63 | Ht 64.0 in | Wt 112.0 lb

## 2018-10-12 DIAGNOSIS — Z23 Encounter for immunization: Secondary | ICD-10-CM

## 2018-10-12 DIAGNOSIS — Z01419 Encounter for gynecological examination (general) (routine) without abnormal findings: Secondary | ICD-10-CM

## 2018-10-12 MED ORDER — TRAZODONE HCL 50 MG PO TABS: 50.0000 mg | ORAL_TABLET | Freq: Every day | ORAL | 2 refills | Status: DC

## 2018-10-12 NOTE — Patient Instructions (Signed)
Preventive Care 18-39 Years, Female Preventive care refers to lifestyle choices and visits with your health care provider that can promote health and wellness. What does preventive care include?  A yearly physical exam. This is also called an annual well check.  Dental exams once or twice a year.  Routine eye exams. Ask your health care provider how often you should have your eyes checked.  Personal lifestyle choices, including: ? Daily care of your teeth and gums. ? Regular physical activity. ? Eating a healthy diet. ? Avoiding tobacco and drug use. ? Limiting alcohol use. ? Practicing safe sex. ? Taking vitamin and mineral supplements as recommended by your health care provider. What happens during an annual well check? The services and screenings done by your health care provider during your annual well check will depend on your age, overall health, lifestyle risk factors, and family history of disease. Counseling Your health care provider may ask you questions about your:  Alcohol use.  Tobacco use.  Drug use.  Emotional well-being.  Home and relationship well-being.  Sexual activity.  Eating habits.  Work and work Statistician.  Method of birth control.  Menstrual cycle.  Pregnancy history.  Screening You may have the following tests or measurements:  Height, weight, and BMI.  Diabetes screening. This is done by checking your blood sugar (glucose) after you have not eaten for a while (fasting).  Blood pressure.  Lipid and cholesterol levels. These may be checked every 5 years starting at age 38.  Skin check.  Hepatitis C blood test.  Hepatitis B blood test.  Sexually transmitted disease (STD) testing.  BRCA-related cancer screening. This may be done if you have a family history of breast, ovarian, tubal, or peritoneal cancers.  Pelvic exam and Pap test. This may be done every 3 years starting at age 38. Starting at age 30, this may be done  every 5 years if you have a Pap test in combination with an HPV test.  Discuss your test results, treatment options, and if necessary, the need for more tests with your health care provider. Vaccines Your health care provider may recommend certain vaccines, such as:  Influenza vaccine. This is recommended every year.  Tetanus, diphtheria, and acellular pertussis (Tdap, Td) vaccine. You may need a Td booster every 10 years.  Varicella vaccine. You may need this if you have not been vaccinated.  HPV vaccine. If you are 39 or younger, you may need three doses over 6 months.  Measles, mumps, and rubella (MMR) vaccine. You may need at least one dose of MMR. You may also need a second dose.  Pneumococcal 13-valent conjugate (PCV13) vaccine. You may need this if you have certain conditions and were not previously vaccinated.  Pneumococcal polysaccharide (PPSV23) vaccine. You may need one or two doses if you smoke cigarettes or if you have certain conditions.  Meningococcal vaccine. One dose is recommended if you are age 68-21 years and a first-year college student living in a residence hall, or if you have one of several medical conditions. You may also need additional booster doses.  Hepatitis A vaccine. You may need this if you have certain conditions or if you travel or work in places where you may be exposed to hepatitis A.  Hepatitis B vaccine. You may need this if you have certain conditions or if you travel or work in places where you may be exposed to hepatitis B.  Haemophilus influenzae type b (Hib) vaccine. You may need this  if you have certain risk factors.  Talk to your health care provider about which screenings and vaccines you need and how often you need them. This information is not intended to replace advice given to you by your health care provider. Make sure you discuss any questions you have with your health care provider. Document Released: 01/17/2002 Document Revised:  08/10/2016 Document Reviewed: 09/22/2015 Elsevier Interactive Patient Education  2018 Elsevier Inc.  

## 2018-10-12 NOTE — Addendum Note (Signed)
Addended by: Keturah Barre L on: 10/12/2018 01:19 PM   Modules accepted: Orders

## 2018-10-12 NOTE — Progress Notes (Signed)
Subjective:   Andrea Perkins is a 37 y.o. G45P4004 Caucasian female here for a routine well-woman exam.  Patient's last menstrual period was 09/23/2018.    Current complaints: none PCP: [facility]       does desire labs  Social History: Sexual: heterosexual Marital Status: married Living situation: with family Occupation: homemaker Tobacco/alcohol: no tobacco use Illicit drugs: no history of illicit drug use  The following portions of the patient's history were reviewed and updated as appropriate: allergies, current medications, past family history, past medical history, past social history, past surgical history and problem list.  Past Medical History Past Medical History:  Diagnosis Date  . Chicken pox   . H/O pulmonary embolus during pregnancy 09/22/2015  . Pulmonary embolism affecting pregnancy 3/16  . Pulmonary embolism affecting pregnancy 4/16    Past Surgical History Past Surgical History:  Procedure Laterality Date  . VAGINAL DELIVERY     x 3  . WISDOM TOOTH EXTRACTION    . WISDOM TOOTH EXTRACTION      Gynecologic History [id number]  Patient's last menstrual period was 09/23/2018. Contraception: condoms Last Pap: 2017. Results were: normal   Obstetric History OB History  Gravida Para Term Preterm AB Living  4 4 4     4   SAB TAB Ectopic Multiple Live Births        0 4    # Outcome Date GA Lbr Len/2nd Weight Sex Delivery Anes PTL Lv  4 Term 09/24/15 [redacted]w[redacted]d / 00:25 8 lb 11.3 oz (3.95 kg) M Vag-Spont EPI  LIV  3 Term 10/11/10   8 lb 6 oz (3.799 kg) F Vag-Spont  N LIV  2 Term 02/08/08   8 lb 11 oz (3.941 kg) M Vag-Spont   LIV  1 Term 06/11/06   8 lb (3.629 kg) M Vag-Spont EPI  LIV    Current Medications Current Outpatient Medications on File Prior to Visit  Medication Sig Dispense Refill  . Homeopathic Products (HYLAFEM) SUPP Place 1 suppository vaginally as needed. (Patient not taking: Reported on 10/12/2018) 10 suppository 0  . levocetirizine (XYZAL) 5 MG tablet  Take 1 tablet by mouth daily.  10  . PARoxetine (PAXIL) 20 MG tablet Take 1 tablet (20 mg total) by mouth daily. (Patient not taking: Reported on 10/12/2018) 90 tablet 3   No current facility-administered medications on file prior to visit.     Review of Systems Patient denies any headaches, blurred vision, shortness of breath, chest pain, abdominal pain, problems with bowel movements, urination, or intercourse.  Objective:  BP 114/68   Pulse 63   Ht 5\' 4"  (1.626 m)   Wt 112 lb (50.8 kg)   LMP 09/23/2018   BMI 19.22 kg/m  Physical Exam  General:  Well developed, well nourished, no acute distress. She is alert and oriented x3. Skin:  Warm and dry Neck:  Midline trachea, no thyromegaly or nodules Cardiovascular: Regular rate and rhythm, no murmur heard Lungs:  Effort normal, all lung fields clear to auscultation bilaterally Breasts:  No dominant palpable mass, retraction, or nipple discharge Abdomen:  Soft, non tender, no hepatosplenomegaly or masses Pelvic:  External genitalia is normal in appearance.  The vagina is normal in appearance. The cervix is bulbous, no CMT.  Thin prep pap is not done . Uterus is felt to be normal size, shape, and contour.  No adnexal masses or tenderness noted. Extremities:  No swelling or varicosities noted Psych:  She has a normal mood and affect  Assessment:  Healthy well-woman exam Needs flu vaccine Family history of breast cancer in mother  Plan:  Labs obtained will follow up accordingly. Flu vaccine given F/U 1 year for AE, or sooner if needed Screening mammogram ordered.   Melody Rockney Ghee, CNM

## 2018-10-13 LAB — COMPREHENSIVE METABOLIC PANEL
A/G RATIO: 1.6 (ref 1.2–2.2)
ALK PHOS: 64 IU/L (ref 39–117)
ALT: 26 IU/L (ref 0–32)
AST: 18 IU/L (ref 0–40)
Albumin: 4.5 g/dL (ref 3.5–5.5)
BILIRUBIN TOTAL: 0.3 mg/dL (ref 0.0–1.2)
BUN/Creatinine Ratio: 13 (ref 9–23)
BUN: 11 mg/dL (ref 6–20)
CALCIUM: 9.9 mg/dL (ref 8.7–10.2)
CHLORIDE: 102 mmol/L (ref 96–106)
CO2: 24 mmol/L (ref 20–29)
Creatinine, Ser: 0.86 mg/dL (ref 0.57–1.00)
GFR calc Af Amer: 101 mL/min/{1.73_m2} (ref >59)
GFR, EST NON AFRICAN AMERICAN: 87 mL/min/{1.73_m2} (ref >59)
Globulin, Total: 2.8 g/dL (ref 1.5–4.5)
Glucose: 81 mg/dL (ref 65–99)
Potassium: 4.3 mmol/L (ref 3.5–5.2)
SODIUM: 139 mmol/L (ref 134–144)
Total Protein: 7.3 g/dL (ref 6.0–8.5)

## 2018-10-13 LAB — LIPID PANEL
CHOL/HDL RATIO: 3 ratio (ref 0.0–4.4)
Cholesterol, Total: 173 mg/dL (ref 100–199)
HDL: 57 mg/dL (ref >39)
LDL CALC: 97 mg/dL (ref 0–99)
TRIGLYCERIDES: 97 mg/dL (ref 0–149)
VLDL CHOLESTEROL CAL: 19 mg/dL (ref 5–40)

## 2018-10-17 ENCOUNTER — Encounter: Payer: BLUE CROSS/BLUE SHIELD | Admitting: Obstetrics and Gynecology

## 2018-10-17 LAB — CYTOLOGY - PAP
Adequacy: ABSENT
Diagnosis: NEGATIVE
HPV: NOT DETECTED

## 2018-10-24 ENCOUNTER — Ambulatory Visit (INDEPENDENT_AMBULATORY_CARE_PROVIDER_SITE_OTHER): Payer: BLUE CROSS/BLUE SHIELD | Admitting: Internal Medicine

## 2018-10-24 ENCOUNTER — Encounter: Payer: Self-pay | Admitting: Internal Medicine

## 2018-10-24 VITALS — BP 102/60 | HR 63 | Temp 97.5°F | Ht 64.0 in | Wt 112.0 lb

## 2018-10-24 DIAGNOSIS — R200 Anesthesia of skin: Secondary | ICD-10-CM | POA: Insufficient documentation

## 2018-10-24 LAB — VITAMIN B12: VITAMIN B 12: 452 pg/mL (ref 211–911)

## 2018-10-24 LAB — T4, FREE: FREE T4: 0.84 ng/dL (ref 0.60–1.60)

## 2018-10-24 NOTE — Progress Notes (Signed)
Subjective:    Patient ID: Andrea Perkins, female    DOB: November 26, 1981, 37 y.o.   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   No current facility-administered medications on file prior to visit.     Allergies  Allergen Reactions  . Amoxicillin Anaphylaxis and Rash    this medication makes pt's ears itch.  . Amoxicillin Itching  . Lactulose Other (See Comments)  . Milk-Related Compounds Hives    Along with congestion  . Polysporin [Bacitracin-Polymyxin B] Itching  . Gold-Containing Drug Products Rash    Past Medical History:  Diagnosis Date  . Chicken pox   . H/O pulmonary embolus during pregnancy 09/22/2015  . Pulmonary embolism affecting pregnancy 3/16  . Pulmonary embolism affecting pregnancy 4/16    Past Surgical History:  Procedure Laterality Date  . VAGINAL DELIVERY     x 3  . WISDOM TOOTH EXTRACTION    . WISDOM TOOTH EXTRACTION      Family History  Problem Relation Age of Onset  . Hypertension Mother   . Hyperlipidemia Mother   . Cancer Mother 41s       Breast  . Thyroid disease Sister   . Alcohol abuse Brother   . Drug abuse Brother   . Cancer Brother        "pleural" cancer  . Alcohol abuse Maternal Grandfather   . Drug abuse Maternal Grandfather   . Mental illness Maternal Grandfather        ?bipolar    Social History   Socioeconomic History  . Marital status: Married    Spouse name: Not on file  . Number of children: 4  . Years of education: Not on file  . Highest education level: Not on file  Occupational History  . Occupation:  Stay at [employer]  . Financial resource strain: Not on file  . Food insecurity:    Worry: Not on file    Inability: Not on file  . Transportation needs:    Medical: Not on file    Non-medical: Not on file  Tobacco Use  . Smoking status: Never Smoker  . Smokeless tobacco: Never Used  Substance and Sexual Activity  . Alcohol use: No    Alcohol/week: 0.0 standard drinks  . Drug use: No  . Sexual activity: Yes    Birth control/protection: Condom  Lifestyle  . Physical activity:    Days per week: Not on file    Minutes per session: Not on file  . Stress: Not on file  Relationships  . Social connections:    Talks on phone: Not on file    Gets together: Not on file    Attends religious service: Not on file    Active member of club or organization: Not on file    Attends meetings of clubs or organizations: Not on file    Relationship status: Not on file  . Intimate partner violence:    Fear of current or ex partner: Not on file    Emotionally abused: Not on  file    Physically abused: Not on file    Forced sexual activity: Not on file  Other Topics Concern  . Not on file  Social History Narrative   Husband teaches finance at [employer] No joint swelling or pain Toes get numb easily and cold---hands also Not sick No fevers    Objective:   Physical Exam  Constitutional: She appears well-developed. No distress.  Cardiovascular: Intact distal pulses.  Musculoskeletal:  No joint swelling or tenderness  Neurological:  Normal strength in hands  Skin: No rash noted.           Assessment & Plan:

## 2018-10-24 NOTE — Assessment & Plan Note (Signed)
Likely mild nerve compression symptoms  Discussed using wrist splints for yoga if worsens Will check B12, thyroid---just in case (general blood panel recently normal)

## 2018-11-07 ENCOUNTER — Telehealth: Payer: Self-pay | Admitting: *Deleted

## 2018-11-07 NOTE — Telephone Encounter (Signed)
Okay to schedule MMR since that is what we have

## 2018-11-07 NOTE — Telephone Encounter (Signed)
Spoke to pt who states her husband is an Glass blower/designer at [employer] where there has been an outbreak of mumps; one of which, is his Ship broker. Pts husband received his immunization on campus, but pt is wanting to receive here in office. Wanted to confirm ok before scheduling. pls advise

## 2018-11-08 NOTE — Telephone Encounter (Signed)
I called patient to schedule appointment and she has changed her mind about getting the shot.  She said her husband isn't sure that he was exposed to the patient.  She said if she decides to get the shot, she'll call back to schedule the appointment.

## 2018-11-08 NOTE — Telephone Encounter (Signed)
Thank you :)

## 2020-03-11 DIAGNOSIS — T7840XA Allergy, unspecified, initial encounter: Secondary | ICD-10-CM | POA: Diagnosis not present

## 2020-03-13 ENCOUNTER — Telehealth: Payer: Self-pay

## 2020-03-13 NOTE — Telephone Encounter (Signed)
So it sounds like she got weak enough to fall but did not totally loose consciousness since she was talking.  Let me know if any injuries from the fall  Ibuprofen or tylenol for fever and flu like symptoms  Keep up a good fluid intake   If symptoms worsen or do not respond to the medication I would go to ER.  If any more weakness so bad that she could fall (or dizziness or severe headache) - also go to ER Please keep [place] posted I will cc PCP

## 2020-03-13 NOTE — Telephone Encounter (Signed)
Called spouse # and it went straight to VM, called pt directly and spoke with her. She is doing better, she didn't injure herself that she's aware of. Pt advised of Dr. Marliss Coots instructions and she verbalized understanding

## 2020-03-13 NOTE — Telephone Encounter (Signed)
Spoke with Andrea Perkins's husband and Andrea Perkins was also answering in the background. Andrea Perkins received her J&J COVID vaccine today and she did fine with it at first, no symptoms until she got home. She was talking to her husband at home and said she feels like she is going to pass out and started to walk to the couch but did not make it  and passed out on the kitchen floor. Husband states Andrea Perkins was still able to talk to him as she was on the floor for about 2 minutes but her body was too weak to move. After about 2 minutes Andrea Perkins was able to get up and husband helped getting her to the couch. She has been resting now and even has gone to the bathroom a minute ago. She did not vomit or loose conscious. But she has now developed flu like symptoms- very ache all over, shivering, looks pale. Husband states it is like she went from 0 to 100 with flu like symptoms all of a sudden. Temp is 98.8. She has taking Ibuprofen for the body aches. They wanted to make sure that she did not need to go to the ER or any other recommendations.   Dr Alla German Andrea Perkins

## 2020-03-15 NOTE — Telephone Encounter (Signed)
Please check on her on MOnday

## 2020-03-16 NOTE — Telephone Encounter (Signed)
Tried to call pt. No answer and no VM set up.

## 2020-03-17 NOTE — Telephone Encounter (Signed)
Tried to call, again. Went straight to message stating VM not set up

## 2020-03-18 NOTE — Telephone Encounter (Signed)
Spoke to pt. She said it took about 48 hours to feel better, but that she was feeling better.

## 2020-04-27 ENCOUNTER — Other Ambulatory Visit: Payer: Self-pay

## 2020-04-27 ENCOUNTER — Ambulatory Visit (INDEPENDENT_AMBULATORY_CARE_PROVIDER_SITE_OTHER): Payer: BC Managed Care – PPO | Admitting: Dermatology

## 2020-04-27 DIAGNOSIS — Z86018 Personal history of other benign neoplasm: Secondary | ICD-10-CM

## 2020-04-27 DIAGNOSIS — L814 Other melanin hyperpigmentation: Secondary | ICD-10-CM

## 2020-04-27 DIAGNOSIS — R21 Rash and other nonspecific skin eruption: Secondary | ICD-10-CM

## 2020-04-27 DIAGNOSIS — Z1283 Encounter for screening for malignant neoplasm of skin: Secondary | ICD-10-CM | POA: Diagnosis not present

## 2020-04-27 DIAGNOSIS — D229 Melanocytic nevi, unspecified: Secondary | ICD-10-CM

## 2020-04-27 DIAGNOSIS — L821 Other seborrheic keratosis: Secondary | ICD-10-CM

## 2020-04-27 DIAGNOSIS — L230 Allergic contact dermatitis due to metals: Secondary | ICD-10-CM | POA: Diagnosis not present

## 2020-04-27 DIAGNOSIS — I781 Nevus, non-neoplastic: Secondary | ICD-10-CM

## 2020-04-27 DIAGNOSIS — L578 Other skin changes due to chronic exposure to nonionizing radiation: Secondary | ICD-10-CM

## 2020-04-27 DIAGNOSIS — D1801 Hemangioma of skin and subcutaneous tissue: Secondary | ICD-10-CM

## 2020-04-27 NOTE — Progress Notes (Signed)
   Follow-Up Visit   Subjective  Andrea Perkins is a 39 y.o. female who presents for the following: Annual Exam (History of dysplastic nevus of right prox ant lat thigh) and Rash (of face - seems worse when she eats eats foods high in nickel)  Patient presents for total-body skin exam for skin cancer screening and mole check.  The following portions of the chart were reviewed this encounter and updated as appropriate:  Tobacco  Allergies  Meds  Problems  Med Hx  Surg Hx  Fam Hx      Review of Systems:  No other skin or systemic complaints except as noted in HPI or Assessment and Plan.  Objective  Well appearing patient in no apparent distress; mood and affect are within normal limits.  A full examination was performed including scalp, head, eyes, ears, nose, lips, neck, chest, axillae, abdomen, back, buttocks, bilateral upper extremities, bilateral lower extremities, hands, feet, fingers, toes, fingernails, and toenails. All findings within normal limits unless otherwise noted below.  Objective  Right prox lat ant thigh: Scar with no evidence of recurrence.   Objective  Right infraorbital: Dilated blood vessel.   Assessment & Plan    Lentigines - Scattered tan macules - Discussed due to sun exposure - Benign, observe - Call for any changes  Seborrheic Keratoses - Stuck-on, waxy, tan-brown papules and plaques  - Discussed benign etiology and prognosis. - Observe - Call for any changes  Melanocytic Nevi - Tan-brown and/or pink-flesh-colored symmetric macules and papules - Benign appearing on exam today - Observation - Call clinic for new or changing moles - Recommend daily use of broad spectrum spf 30+ sunscreen to sun-exposed areas.   Hemangiomas - Red papules - Discussed benign nature - Observe - Call for any changes  Actinic Damage - diffuse scaly erythematous macules with underlying dyspigmentation - Recommend daily broad spectrum sunscreen SPF 30+ to  sun-exposed areas, reapply every 2 hours as needed.  - Call for new or changing lesions.  Skin cancer screening performed today.    History of dysplastic nevus Right prox lat ant thigh  Clear today. Observe   Rash -allergic contact, patch test proven to nickel and Gold Head - Anterior (Face)  Continue to try to avoid eating foods high in nickel due to nickel allergy.  Telangiectasia Right infraorbital  Benign, observe.    Return in about 1 year (around 04/27/2021).  I, Ashok Cordia, CMA, am acting as scribe for Sarina Ser, MD .  Documentation: I have reviewed the above documentation for accuracy and completeness, and I agree with the above.  Sarina Ser, MD

## 2020-04-28 ENCOUNTER — Encounter: Payer: Self-pay | Admitting: Dermatology

## 2020-07-07 NOTE — Progress Notes (Signed)
[facility] Note  07/08/2020     CHIEF COMPLAINT Patient presents for Retina Evaluation   HISTORY OF PRESENT ILLNESS: Andrea Perkins is a 39 y.o. female who presents to the clinic today for:   HPI    Retina Evaluation    In right eye.  I, the attending physician,  performed the HPI with the patient and updated documentation appropriately.          Comments    Patient here for Retina Evaluation. Referred by Dr Ellin Mayhew for atrophic retinal hole OD. Patient states vision doing good. No eye pain.        Last edited by Bernarda Caffey, MD on 07/08/2020 12:17 PM. (History)    pt is here on the referral of Dr. Ellin Mayhew for concern of retinal hole OD, pt states she went to see him for routine eye exam to get new CL, she is not experiencing any FOL or floaters  Referring physician: Dr. Anell Barr, [place] [address]  HISTORICAL INFORMATION:   Selected notes from the MEDICAL RECORD NUMBER Referred by Dr. Ellin Mayhew LEE: 07.14.21 BCVA: 20/20 OU Ocular Hx- Atrophic Retinal Hole OD     CURRENT MEDICATIONS: Current Outpatient Medications (Ophthalmic Drugs)  Medication Sig  . prednisoLONE acetate (PRED FORTE) 1 % ophthalmic suspension Place 1 drop into the right eye 4 (four) times daily for 7 days.   No current facility-administered medications for this visit. (Ophthalmic Drugs)   No current outpatient medications on file. (Other)   No current facility-administered medications for this visit. (Other)      REVIEW OF SYSTEMS: ROS    Positive for: Eyes   Last edited by Theodore Demark, COA on 07/08/2020  9:27 AM. (History)       ALLERGIES Allergies  Allergen Reactions  . Amoxicillin Anaphylaxis and Rash    this medication makes pt's ears itch.  . Amoxicillin Itching  . Lactulose Other (See Comments)  . Milk-Related Compounds Hives    Along with congestion  . Polysporin [Bacitracin-Polymyxin B] Itching  . Gold-Containing  Drug Products Rash    PAST MEDICAL HISTORY Past Medical History:  Diagnosis Date  . Atypical mole 01/14/2016   R prox ant lateral thigh/mild  . Chicken pox   . H/O pulmonary embolus during pregnancy 09/22/2015  . Pulmonary embolism affecting pregnancy 3/16  . Pulmonary embolism affecting pregnancy 4/16   Past Surgical History:  Procedure Laterality Date  . VAGINAL DELIVERY     x 3  . WISDOM TOOTH EXTRACTION    . WISDOM TOOTH EXTRACTION      FAMILY HISTORY Family History  Problem Relation Age of Onset  . Hypertension Mother   . Hyperlipidemia Mother   . Cancer Mother 68s       Breast  . Thyroid disease Sister   . Alcohol abuse Brother   . Drug abuse Brother   . Cancer Brother        "pleural" cancer  . Alcohol abuse Maternal Grandfather   . Drug abuse Maternal Grandfather   . Mental illness Maternal Grandfather        ?bipolar    SOCIAL HISTORY Social History   Tobacco Use  . Smoking status: Never Smoker  . Smokeless tobacco: Never Used  Substance Use Topics  . Alcohol use: No    Alcohol/week: 0.0 standard drinks  . Drug use: No         OPHTHALMIC EXAM:  Base Eye  Exam    Visual Acuity (Snellen - Linear)      Right Left   Dist cc 20/20 20/20   Correction: Glasses       Tonometry (Tonopen, 9:24 AM)      Right Left   Pressure 14 14       Pupils      Dark Light Shape React APD   Right 3 2 Round Brisk None   Left 3 2 Round Brisk None       Visual Fields (Counting fingers)      Left Right    Full Full       Extraocular Movement      Right Left    Full, Ortho Full, Ortho       Neuro/Psych    Oriented x3: Yes   Mood/Affect: Normal       Dilation    Both eyes: 1.0% Mydriacyl, 2.5% Phenylephrine @ 9:24 AM        Slit Lamp and Fundus Exam    Slit Lamp Exam      Right Left   Lids/Lashes Normal Normal   Conjunctiva/Sclera White and quiet White and quiet   Cornea Trace Punctate epithelial erosions, trace, fine endo pigment Trace  Punctate epithelial erosions, trace, fine endo pigment   Anterior Chamber Deep and quiet Deep and quiet   Iris Round and dilated Round and dilated   Lens Clear Clear   Vitreous Mild Vitreous syneresis Mild Vitreous syneresis       Fundus Exam      Right Left   Disc Pink and Sharp Pink and Sharp   C/D Ratio 0.4 0.4   Macula Flat, Good foveal reflex, No heme or edema Flat, Good foveal reflex, No heme or edema   Vessels Normal Normal   Periphery Attached, focal patch of lattice at 1100, focal patch of fine lattice at 0700, pigmented lattice at 0730, operculated hole at 0800 Attached, round hole at 1230, focal pigment clump at 0300, mild cystoid degeneration, lattice with atrophic hole at 0600        Refraction    Wearing Rx      Sphere Cylinder Axis   Right -3.00 +1.00 095   Left -2.25 +0.75 095       Manifest Refraction      Sphere Cylinder Axis Dist VA   Right -3.25 +1.00 100 20/20+   Left -2.25 +1.00 095 20/20+          IMAGING AND PROCEDURES  Imaging and Procedures for 07/08/2020  OCT, Retina - OU - Both Eyes       Right Eye Quality was good. Central Foveal Thickness: 286. Progression has no prior data. Findings include normal foveal contour, no IRF, no SRF, vitreomacular adhesion .   Left Eye Quality was good. Central Foveal Thickness: 289. Progression has no prior data. Findings include normal foveal contour, no IRF, no SRF, vitreomacular adhesion .   Notes *Images captured and stored on drive  Diagnosis / Impression:  NFP, No IRF/SRF OU  Clinical management:  See below  Abbreviations: NFP - Normal foveal profile. CME - cystoid macular edema. PED - pigment epithelial detachment. IRF - intraretinal fluid. SRF - subretinal fluid. EZ - ellipsoid zone. ERM - epiretinal membrane. ORA - outer retinal atrophy. ORT - outer retinal tubulation. SRHM - subretinal hyper-reflective material. IRHM - intraretinal hyper-reflective material       Repair Retinal Breaks,  Laser - OD - Right Eye  LASER PROCEDURE NOTE  Procedure:  Barrier laser retinopexy using slit lamp laser, right eye   Diagnosis:   Lattice degeneration w/ atrophic holes, right eye                     Patches of lattice: 1100, 0700, 0730                         Atrophic hole at 0800  Surgeon: Bernarda Caffey, MD, PhD  Anesthesia: Topical  Informed consent obtained, operative eye marked, and time out performed prior to initiation of laser.   Laser settings:  Lumenis Smart532 laser, slit lamp Lens: Mainster PRP 165 Power: 250 mW Spot size: 200 microns Duration: 30 msec  # spots: [id number]  Placement of laser: Using a Mainster PRP 165 contact lens at the slit lamp, laser was placed in three confluent rows around patches of lattice w/ atrophic holes at 1100, 0700, 0730 and 0800 oclock anterior to equator with additional rows anteriorly.  Complications: None.  Patient tolerated the procedure well and received written and verbal post-procedure care information/education.                 ASSESSMENT/PLAN:    ICD-10-CM   1. 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 Retinal hole of both eyes  H33.323 Repair Retinal Breaks, Laser - OD - Right Eye  3. Retinal edema  H35.81 OCT, Retina - OU - Both Eyes  4. Myopia of both eyes with astigmatism  H52.13    H52.203    1,2. Lattice degeneration, both eyes - OD: focal patch of lattice at 1100, focal patch of fine lattice at 0700, pigmented lattice at 0730, operculated hole at 0800 - OS: round hole at 1230, lattice with atrophic hole at 0600 - discussed findings, prognosis, and treatment options including observation - recommend laser retinopexy OU, OD first today, 08.04.21 - pt wishes to proceed with laser - RBA of procedure discussed, questions answered - informed consent obtained and signed - see procedure note - start PF QID OD x7 days - f/u in 1-2 wks, POV OD, laser  OS  3. No retinal edema on exam or OCT  4. Myopia w/ astigmatism OU  - discussed association of myopia with lattice degeneration and increased risk of RT/RD    Ophthalmic Meds Ordered this visit:  Meds ordered this encounter  Medications  . prednisoLONE acetate (PRED FORTE) 1 % ophthalmic suspension    Sig: Place 1 drop into the right eye 4 (four) times daily for 7 days.    Dispense:  10 mL    Refill:  0       Return for f/u 1-2 weeks, lattice degeneration OU, DFE, OCT.  There are no Patient Instructions on file for this visit.   Explained the diagnoses, plan, and follow up with the patient and they expressed understanding.  Patient expressed understanding of the importance of proper follow up care.   This document serves as a record of services personally performed by Gardiner Sleeper, MD, PhD. It was created on their behalf by Leonie Douglas, an ophthalmic technician. The creation of this record is the provider's dictation and/or activities during the visit.    Electronically signed by: Leonie Douglas COA, 07/08/20  12:22 PM   Gardiner Sleeper, M.D., Ph.D. Diseases & Surgery of the Retina and Vitreous [facility]  I  have reviewed the above documentation for accuracy and completeness, and I agree with the above. Gardiner Sleeper, M.D., Ph.D. 07/08/20 12:22 PM   Abbreviations: M myopia (nearsighted); A astigmatism; H hyperopia (farsighted); P presbyopia; Mrx spectacle prescription;  CTL contact lenses; OD right eye; OS left eye; OU both eyes  XT exotropia; ET esotropia; PEK punctate epithelial keratitis; PEE punctate epithelial erosions; DES dry eye syndrome; MGD meibomian gland dysfunction; ATs artificial tears; PFAT's preservative free artificial tears; [place] nuclear sclerotic cataract; PSC posterior subcapsular cataract; ERM epi-retinal membrane; PVD posterior vitreous detachment; RD retinal detachment; DM diabetes mellitus; DR diabetic retinopathy; NPDR  non-proliferative diabetic retinopathy; PDR proliferative diabetic retinopathy; CSME clinically significant macular edema; DME diabetic macular edema; dbh dot blot hemorrhages; CWS cotton wool spot; POAG primary open angle glaucoma; C/D cup-to-disc ratio; HVF humphrey visual field; GVF goldmann visual field; OCT optical coherence tomography; IOP intraocular pressure; BRVO Branch retinal vein occlusion; CRVO central retinal vein occlusion; CRAO central retinal artery occlusion; BRAO branch retinal artery occlusion; RT retinal tear; SB scleral buckle; PPV pars plana vitrectomy; VH Vitreous hemorrhage; PRP panretinal laser photocoagulation; IVK intravitreal kenalog; VMT vitreomacular traction; MH Macular hole;  NVD neovascularization of the disc; NVE neovascularization elsewhere; AREDS age related eye disease study; ARMD age related macular degeneration; POAG primary open angle glaucoma; EBMD epithelial/anterior basement membrane dystrophy; ACIOL anterior chamber intraocular lens; IOL intraocular lens; PCIOL posterior chamber intraocular lens; Phaco/IOL phacoemulsification with intraocular lens placement; [place] photorefractive keratectomy; LASIK laser assisted in situ keratomileusis; HTN hypertension; DM diabetes mellitus; COPD chronic obstructive pulmonary disease

## 2020-07-08 ENCOUNTER — Ambulatory Visit (INDEPENDENT_AMBULATORY_CARE_PROVIDER_SITE_OTHER): Payer: BC Managed Care – PPO | Admitting: Ophthalmology

## 2020-07-08 ENCOUNTER — Encounter (INDEPENDENT_AMBULATORY_CARE_PROVIDER_SITE_OTHER): Payer: Self-pay | Admitting: Ophthalmology

## 2020-07-08 ENCOUNTER — Other Ambulatory Visit: Payer: Self-pay

## 2020-07-08 DIAGNOSIS — H3581 Retinal edema: Secondary | ICD-10-CM | POA: Diagnosis not present

## 2020-07-08 DIAGNOSIS — H5213 Myopia, bilateral: Secondary | ICD-10-CM

## 2020-07-08 DIAGNOSIS — H52203 Unspecified astigmatism, bilateral: Secondary | ICD-10-CM

## 2020-07-08 DIAGNOSIS — H33323 Round hole, bilateral: Secondary | ICD-10-CM | POA: Diagnosis not present

## 2020-07-08 DIAGNOSIS — H35413 Lattice degeneration of retina, bilateral: Secondary | ICD-10-CM

## 2020-07-08 MED ORDER — PREDNISOLONE ACETATE 1 % OP SUSP
1.0000 [drp] | Freq: Four times a day (QID) | OPHTHALMIC | 0 refills | Status: AC
Start: 2020-07-08 — End: 2020-07-15

## 2020-07-14 NOTE — Progress Notes (Signed)
[facility] Note  07/15/2020     CHIEF COMPLAINT Patient presents for Retina Follow Up   HISTORY OF PRESENT ILLNESS: Andrea Perkins is a 39 y.o. female who presents to the clinic today for:   HPI    Retina Follow Up    Patient presents with  Other.  In both eyes.  This started weeks ago.  Severity is mild.  Duration of 1 week.  Since onset it is stable.  I, the attending physician,  performed the HPI with the patient and updated documentation appropriately.          Comments    39 y/o female pt here for 1 wk f/u for lattice OU.  S/p laser retinopexy OD 8.4.21.  Here for laser retinopexy OS today.  No change in [place] OU.  Reports no problems w/laser.  Denies pain, FOL, floaters.  PF QID OD.       Last edited by Bernarda Caffey, MD on 07/15/2020 10:57 AM. (History)      Referring physician: Dr. Anell Barr, [place] [id number]  HISTORICAL INFORMATION:   Selected notes from the MEDICAL RECORD NUMBER Referred by Dr. Ellin Mayhew LEE: 07.14.21 BCVA: 20/20 OU Ocular Hx- Atrophic Retinal Hole OD     CURRENT MEDICATIONS: Current Outpatient Medications (Ophthalmic Drugs)  Medication Sig  . prednisoLONE acetate (PRED FORTE) 1 % ophthalmic suspension Place 1 drop into the right eye 4 (four) times daily for 7 days.   No current facility-administered medications for this visit. (Ophthalmic Drugs)   No current outpatient medications on file. (Other)   No current facility-administered medications for this visit. (Other)      REVIEW OF SYSTEMS: ROS    Positive for: Eyes   Negative for: Constitutional, Gastrointestinal, Neurological, Skin, Genitourinary, Musculoskeletal, HENT, Endocrine, Cardiovascular, Respiratory, Psychiatric, Allergic/Imm, Heme/Lymph   Last edited by Matthew Folks, COA on 07/15/2020  9:49 AM. (History)       ALLERGIES Allergies  Allergen Reactions  . Amoxicillin Anaphylaxis and Rash    this medication makes  pt's ears itch.  . Amoxicillin Itching  . Lactulose Other (See Comments)  . Milk-Related Compounds Hives    Along with congestion  . Polysporin [Bacitracin-Polymyxin B] Itching  . Gold-Containing Drug Products Rash    PAST MEDICAL HISTORY Past Medical History:  Diagnosis Date  . Atypical mole 01/14/2016   R prox ant lateral thigh/mild  . Chicken pox   . H/O pulmonary embolus during pregnancy 09/22/2015  . Pulmonary embolism affecting pregnancy 3/16  . Pulmonary embolism affecting pregnancy 4/16   Past Surgical History:  Procedure Laterality Date  . VAGINAL DELIVERY     x 3  . WISDOM TOOTH EXTRACTION    . WISDOM TOOTH EXTRACTION      FAMILY HISTORY Family History  Problem Relation Age of Onset  . Hypertension Mother   . Hyperlipidemia Mother   . Cancer Mother 40s       Breast  . Thyroid disease Sister   . Alcohol abuse Brother   . Drug abuse Brother   . Cancer Brother        "pleural" cancer  . Alcohol abuse Maternal Grandfather   . Drug abuse Maternal Grandfather   . Mental illness Maternal Grandfather        ?bipolar    SOCIAL HISTORY Social History   Tobacco Use  . Smoking status: Never Smoker  . Smokeless tobacco: Never Used  Substance Use Topics  .  Alcohol use: No    Alcohol/week: 0.0 standard drinks  . Drug use: No         OPHTHALMIC EXAM:  Base Eye Exam    Visual Acuity (Snellen - Linear)      Right Left   Dist cc 20/20 20/20   Correction: Glasses       Tonometry (Tonopen, 9:54 AM)      Right Left   Pressure 11 13       Pupils      Dark Light Shape React APD   Right 3 2 Round Brisk None   Left 3 2 Round Brisk None       Visual Fields (Counting fingers)      Left Right    Full Full       Extraocular Movement      Right Left    Full, Ortho Full, Ortho       Neuro/Psych    Oriented x3: Yes   Mood/Affect: Normal       Dilation    Both eyes: 1.0% Mydriacyl, 2.5% Phenylephrine @ 9:54 AM        Slit Lamp and Fundus  Exam    Slit Lamp Exam      Right Left   Lids/Lashes Normal Normal   Conjunctiva/Sclera White and quiet White and quiet   Cornea Trace Punctate epithelial erosions, trace, fine endo pigment Trace Punctate epithelial erosions, trace, fine endo pigment   Anterior Chamber Deep and quiet Deep and quiet   Iris Round and dilated Round and dilated   Lens Clear Clear   Vitreous Mild Vitreous syneresis Mild Vitreous syneresis       Fundus Exam      Right Left   Disc Pink and Sharp Pink and Sharp   C/D Ratio 0.4 0.4   Macula Flat, Good foveal reflex, No heme or edema Flat, Good foveal reflex, No heme or edema   Vessels Normal Normal   Periphery Attached, focal patch of lattice at 1100, focal patch of fine lattice at 0700, pigmented lattice at 0730, operculated hole at 0800; good laser surrounding all lesion Attached, round hole at 1230, focal pigment clump w/ adjacent hole at 0500, lattice with atrophic hole at 0600. mild cystoid degeneration          IMAGING AND PROCEDURES  Imaging and Procedures for 07/15/2020  OCT, Retina - OU - Both Eyes       Right Eye Quality was good. Central Foveal Thickness: 288. Progression has been stable. Findings include normal foveal contour, no IRF, no SRF, vitreomacular adhesion .   Left Eye Quality was good. Central Foveal Thickness: 286. Progression has been stable. Findings include normal foveal contour, no IRF, no SRF, vitreomacular adhesion .   Notes *Images captured and stored on drive  Diagnosis / Impression:  NFP, No IRF/SRF OU  Clinical management:  See below  Abbreviations: NFP - Normal foveal profile. CME - cystoid macular edema. PED - pigment epithelial detachment. IRF - intraretinal fluid. SRF - subretinal fluid. EZ - ellipsoid zone. ERM - epiretinal membrane. ORA - outer retinal atrophy. ORT - outer retinal tubulation. SRHM - subretinal hyper-reflective material. IRHM - intraretinal hyper-reflective material       Repair Retinal  Breaks, Laser - OS - Left Eye       Time Out Confirmed correct patient, procedure, site, and patient consented.   Anesthesia Topical anesthesia was used. Anesthetic medications included Proparacaine 0.5%.   Notes LASER PROCEDURE NOTE  Procedure:  Barrier laser retinopexy using slit lamp laser, LEFT EYE   Diagnosis:   Lattice degeneration w/ atrophic holes, LEFT eye                     Patches of lattice: 1230 superiorly; 0500-0600 inferiorly  Surgeon: Bernarda Caffey, MD, PhD  Anesthesia: Topical  Informed consent obtained, operative eye marked, and time out performed prior to initiation of laser.   Laser settings:  Lumenis Smart532 laser, slit lamp Lens: Mainster PRP 165 Power: 230 mW Spot size: 200 microns Duration: 30 msec  # spots: [id number]  Placement of laser: Using a Mainster PRP 165 contact lens at the slit lamp, laser was placed in three confluent rows around patches of lattice and atrophic holes at 1230 and 0500-0600 oclock anterior to equator with additional rows anteriorly.  Complications: None.  Patient tolerated the procedure well and received written and verbal post-procedure care information/education.                  ASSESSMENT/PLAN:    ICD-10-CM   1. Bilateral retinal lattice degeneration  H35.413 Repair Retinal Breaks, Laser - OS - Left Eye  2. Retinal hole of both eyes  H33.323 Repair Retinal Breaks, Laser - OS - Left Eye  3. Retinal edema  H35.81 OCT, Retina - OU - Both Eyes  4. Myopia of both eyes with astigmatism  H52.13    H52.203    1,2. Lattice degeneration, both eyes - OD: focal patch of lattice at 1100, focal patch of fine lattice at 0700, pigmented lattice at 0730, operculated hole at 0800 - OS: round hole at 1230, lattice with atrophic hole at 0600 - S/p laser retinopexy OD 08.04.21 -- good laser in place - Recommend laser retinopexy OS today, 8.11.21 - pt wishes to proceed with laser - RBA of procedure discussed, questions  answered - informed consent obtained and signed - see procedure note - start PF QID OS x7 days - f/u in 1-2 wks, POV  3. No retinal edema on exam or OCT  4. Myopia w/ astigmatism OU  - discussed link between myopia and lattice degeneration, discussed increased risk of RT/RD    Ophthalmic Meds Ordered this visit:  No orders of the defined types were placed in this encounter.      Return in about 3 weeks (around 08/05/2020) for POV s/p laser retinopexy OU for lattice.  There are no Patient Instructions on file for this visit.   Explained the diagnoses, plan, and follow up with the patient and they expressed understanding.  Patient expressed understanding of the importance of proper follow up care.   This document serves as a record of services personally performed by Gardiner Sleeper, MD, PhD. It was created on their behalf by Roselee Nova, COMT. The creation of this record is the provider's dictation and/or activities during the visit.  Electronically signed by: Roselee Nova, COMT 07/15/20 12:22 PM   This document serves as a record of services personally performed by Gardiner Sleeper, MD, PhD. It was created on their behalf by San Jetty. Owens Shark, OA an ophthalmic technician. The creation of this record is the provider's dictation and/or activities during the visit.    Electronically signed by: San Jetty. Owens Shark, [place] 08.11.2021 12:22 PM   Gardiner Sleeper, M.D., Ph.D. Diseases & Surgery of the Retina and Vitreous [facility]  I have reviewed the above documentation for accuracy and completeness, and I agree with the above.  Gardiner Sleeper, M.D., Ph.D. 07/15/20 12:22 PM   Abbreviations: M myopia (nearsighted); A astigmatism; H hyperopia (farsighted); P presbyopia; Mrx spectacle prescription;  CTL contact lenses; OD right eye; OS left eye; OU both eyes  XT exotropia; ET esotropia; PEK punctate epithelial keratitis; PEE punctate epithelial erosions; DES dry eye  syndrome; MGD meibomian gland dysfunction; ATs artificial tears; PFAT's preservative free artificial tears; [place] nuclear sclerotic cataract; PSC posterior subcapsular cataract; ERM epi-retinal membrane; PVD posterior vitreous detachment; RD retinal detachment; DM diabetes mellitus; DR diabetic retinopathy; NPDR non-proliferative diabetic retinopathy; PDR proliferative diabetic retinopathy; CSME clinically significant macular edema; DME diabetic macular edema; dbh dot blot hemorrhages; CWS cotton wool spot; POAG primary open angle glaucoma; C/D cup-to-disc ratio; HVF humphrey visual field; GVF goldmann visual field; OCT optical coherence tomography; IOP intraocular pressure; BRVO Branch retinal vein occlusion; CRVO central retinal vein occlusion; CRAO central retinal artery occlusion; BRAO branch retinal artery occlusion; RT retinal tear; SB scleral buckle; PPV pars plana vitrectomy; VH Vitreous hemorrhage; PRP panretinal laser photocoagulation; IVK intravitreal kenalog; VMT vitreomacular traction; MH Macular hole;  NVD neovascularization of the disc; NVE neovascularization elsewhere; AREDS age related eye disease study; ARMD age related macular degeneration; POAG primary open angle glaucoma; EBMD epithelial/anterior basement membrane dystrophy; ACIOL anterior chamber intraocular lens; IOL intraocular lens; PCIOL posterior chamber intraocular lens; Phaco/IOL phacoemulsification with intraocular lens placement; [place] photorefractive keratectomy; LASIK laser assisted in situ keratomileusis; HTN hypertension; DM diabetes mellitus; COPD chronic obstructive pulmonary disease

## 2020-07-15 ENCOUNTER — Other Ambulatory Visit: Payer: Self-pay

## 2020-07-15 ENCOUNTER — Ambulatory Visit (INDEPENDENT_AMBULATORY_CARE_PROVIDER_SITE_OTHER): Payer: BC Managed Care – PPO | Admitting: Ophthalmology

## 2020-07-15 ENCOUNTER — Encounter (INDEPENDENT_AMBULATORY_CARE_PROVIDER_SITE_OTHER): Payer: Self-pay | Admitting: Ophthalmology

## 2020-07-15 DIAGNOSIS — H33323 Round hole, bilateral: Secondary | ICD-10-CM

## 2020-07-15 DIAGNOSIS — H5213 Myopia, bilateral: Secondary | ICD-10-CM

## 2020-07-15 DIAGNOSIS — H35413 Lattice degeneration of retina, bilateral: Secondary | ICD-10-CM | POA: Diagnosis not present

## 2020-07-15 DIAGNOSIS — H52203 Unspecified astigmatism, bilateral: Secondary | ICD-10-CM

## 2020-07-15 DIAGNOSIS — H3581 Retinal edema: Secondary | ICD-10-CM

## 2020-07-30 NOTE — Progress Notes (Signed)
[facility] Note  08/04/2020     CHIEF COMPLAINT Patient presents for Retina Follow Up   HISTORY OF PRESENT ILLNESS: Andrea Perkins is a 39 y.o. female who presents to the clinic today for:   HPI    Retina Follow Up    In both eyes.  This started weeks ago.  Severity is moderate.  Duration of weeks.  Since onset it is stable.  I, the attending physician,  performed the HPI with the patient and updated documentation appropriately.          Comments    Pt states vision is good OU.  Patient denies eye pain or discomfort and denies any new or worsening floaters or fol OU.       Last edited by Bernarda Caffey, MD on 08/04/2020 10:09 PM. (History)      Referring physician: Dr. Anell Barr, [place] [id number]  HISTORICAL INFORMATION:   Selected notes from the MEDICAL RECORD NUMBER Referred by Dr. Ellin Mayhew LEE: 07.14.21 BCVA: 20/20 OU Ocular Hx- Atrophic Retinal Hole OD     CURRENT MEDICATIONS: No current outpatient medications on file. (Ophthalmic Drugs)   No current facility-administered medications for this visit. (Ophthalmic Drugs)   No current outpatient medications on file. (Other)   No current facility-administered medications for this visit. (Other)      REVIEW OF SYSTEMS: ROS    Positive for: Eyes   Negative for: Constitutional, Gastrointestinal, Neurological, Skin, Genitourinary, Musculoskeletal, HENT, Endocrine, Cardiovascular, Respiratory, Psychiatric, Allergic/Imm, Heme/Lymph   Last edited by Doneen Poisson on 08/04/2020  9:35 AM. (History)       ALLERGIES Allergies  Allergen Reactions  . Amoxicillin Anaphylaxis and Rash    this medication makes pt's ears itch.  . Amoxicillin Itching  . Lactulose Other (See Comments)  . Milk-Related Compounds Hives    Along with congestion  . Polysporin [Bacitracin-Polymyxin B] Itching  . Gold-Containing Drug Products Rash    PAST MEDICAL HISTORY Past  Medical History:  Diagnosis Date  . Atypical mole 01/14/2016   R prox ant lateral thigh/mild  . Chicken pox   . H/O pulmonary embolus during pregnancy 09/22/2015  . Pulmonary embolism affecting pregnancy 3/16  . Pulmonary embolism affecting pregnancy 4/16   Past Surgical History:  Procedure Laterality Date  . VAGINAL DELIVERY     x 3  . WISDOM TOOTH EXTRACTION    . WISDOM TOOTH EXTRACTION      FAMILY HISTORY Family History  Problem Relation Age of Onset  . Hypertension Mother   . Hyperlipidemia Mother   . Cancer Mother 67s       Breast  . Thyroid disease Sister   . Alcohol abuse Brother   . Drug abuse Brother   . Cancer Brother        "pleural" cancer  . Alcohol abuse Maternal Grandfather   . Drug abuse Maternal Grandfather   . Mental illness Maternal Grandfather        ?bipolar    SOCIAL HISTORY Social History   Tobacco Use  . Smoking status: Never Smoker  . Smokeless tobacco: Never Used  Substance Use Topics  . Alcohol use: No    Alcohol/week: 0.0 standard drinks  . Drug use: No         OPHTHALMIC EXAM:  Base Eye Exam    Visual Acuity (Snellen - Linear)      Right Left   Dist cc 20/20 -1 20/20  Correction: Glasses       Tonometry (Tonopen, 9:39 AM)      Right Left   Pressure 14 14       Pupils      Dark Light Shape React APD   Right 4 3 Round Brisk 0   Left 4 3 Round Brisk 0       Visual Fields      Left Right    Full Full       Extraocular Movement      Right Left    Full Full       Neuro/Psych    Oriented x3: Yes   Mood/Affect: Normal       Dilation    Both eyes: 1.0% Mydriacyl, 2.5% Phenylephrine @ 9:39 AM        Slit Lamp and Fundus Exam    Slit Lamp Exam      Right Left   Lids/Lashes Normal Normal   Conjunctiva/Sclera White and quiet White and quiet   Cornea Trace Punctate epithelial erosions, trace, fine endo pigment Trace Punctate epithelial erosions, trace, fine endo pigment   Anterior Chamber Deep and quiet  Deep and quiet   Iris Round and dilated Round and dilated   Lens Clear Clear   Vitreous Mild Vitreous syneresis Mild Vitreous syneresis       Fundus Exam      Right Left   Disc Pink and Sharp Pink and Sharp   C/D Ratio 0.4 0.4   Macula Flat, Good foveal reflex, No heme or edema Flat, Good foveal reflex, No heme or edema   Vessels Normal Normal   Periphery Attached, focal patch of lattice at 1100, focal patch of fine lattice at 0700, pigmented lattice at 0730, operculated hole at 0800; good laser surrounding all lesions Attached, round hole at 1230, focal pigment clump w/ adjacent hole at 0500, lattice with atrophic hole at 0600, good laser surrounding all lesions, mild cystoid degeneration        Refraction    Wearing Rx      Sphere Cylinder Axis   Right -3.00 +1.00 095   Left -2.25 +0.75 095          IMAGING AND PROCEDURES  Imaging and Procedures for 08/04/2020  OCT, Retina - OU - Both Eyes       Right Eye Quality was good. Central Foveal Thickness: 289. Progression has been stable. Findings include normal foveal contour, no IRF, no SRF, vitreomacular adhesion .   Left Eye Quality was good. Central Foveal Thickness: 294. Progression has been stable. Findings include normal foveal contour, no IRF, no SRF, vitreomacular adhesion .   Notes *Images captured and stored on drive  Diagnosis / Impression:  NFP, No IRF/SRF OU  Clinical management:  See below  Abbreviations: NFP - Normal foveal profile. CME - cystoid macular edema. PED - pigment epithelial detachment. IRF - intraretinal fluid. SRF - subretinal fluid. EZ - ellipsoid zone. ERM - epiretinal membrane. ORA - outer retinal atrophy. ORT - outer retinal tubulation. SRHM - subretinal hyper-reflective material. IRHM - intraretinal hyper-reflective material                ASSESSMENT/PLAN:    ICD-10-CM   1. 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 Retinal hole of both eyes  H33.323   3. Retinal  edema  H35.81 OCT, Retina - OU - Both Eyes  4. Myopia of both eyes with astigmatism  H52.13    H52.203  1,2. Lattice degeneration, both eyes - OD: focal patch of lattice at 1100, focal patch of fine lattice at 0700, pigmented lattice at 0730, operculated hole at 0800 - OS: round hole at 1230, lattice with atrophic hole at 0600 - S/p laser retinopexy OD 08.04.21 -- good laser in place - S/p laser retinopexy OS 08.11.21 -- good laser in place - f/u in 3 months  3. No retinal edema on exam or OCT  4. Myopia w/ astigmatism OU  - discussed link between myopia and lattice degeneration, discussed increased risk of RT/RD    Ophthalmic Meds Ordered this visit:  No orders of the defined types were placed in this encounter.      Return in about 3 months (around 11/03/2020) for DFE, OCT.  There are no Patient Instructions on file for this visit.   Explained the diagnoses, plan, and follow up with the patient and they expressed understanding.  Patient expressed understanding of the importance of proper follow up care.   This document serves as a record of services personally performed by Gardiner Sleeper, MD, PhD. It was created on their behalf by Estill Bakes, COT an ophthalmic technician. The creation of this record is the provider's dictation and/or activities during the visit.    Electronically signed by: Estill Bakes, COT 8.26.21 @ 10:11 PM  This document serves as a record of services personally performed by Gardiner Sleeper, MD, PhD. It was created on their behalf by Roselee Nova, COMT. The creation of this record is the provider's dictation and/or activities during the visit.  Electronically signed by: Roselee Nova, COMT 08/04/20 10:11 PM  Gardiner Sleeper, M.D., Ph.D. Diseases & Surgery of the Retina and [place] 08/04/2020   I have reviewed the above documentation for accuracy and completeness, and I agree with the above. Gardiner Sleeper,  M.D., Ph.D. 08/04/20 10:15 PM   Abbreviations: M myopia (nearsighted); A astigmatism; H hyperopia (farsighted); P presbyopia; Mrx spectacle prescription;  CTL contact lenses; OD right eye; OS left eye; OU both eyes  XT exotropia; ET esotropia; PEK punctate epithelial keratitis; PEE punctate epithelial erosions; DES dry eye syndrome; MGD meibomian gland dysfunction; ATs artificial tears; PFAT's preservative free artificial tears; [place] nuclear sclerotic cataract; PSC posterior subcapsular cataract; ERM epi-retinal membrane; PVD posterior vitreous detachment; RD retinal detachment; DM diabetes mellitus; DR diabetic retinopathy; NPDR non-proliferative diabetic retinopathy; PDR proliferative diabetic retinopathy; CSME clinically significant macular edema; DME diabetic macular edema; dbh dot blot hemorrhages; CWS cotton wool spot; POAG primary open angle glaucoma; C/D cup-to-disc ratio; HVF humphrey visual field; GVF goldmann visual field; OCT optical coherence tomography; IOP intraocular pressure; BRVO Branch retinal vein occlusion; CRVO central retinal vein occlusion; CRAO central retinal artery occlusion; BRAO branch retinal artery occlusion; RT retinal tear; SB scleral buckle; PPV pars plana vitrectomy; VH Vitreous hemorrhage; PRP panretinal laser photocoagulation; IVK intravitreal kenalog; VMT vitreomacular traction; MH Macular hole;  NVD neovascularization of the disc; NVE neovascularization elsewhere; AREDS age related eye disease study; ARMD age related macular degeneration; POAG primary open angle glaucoma; EBMD epithelial/anterior basement membrane dystrophy; ACIOL anterior chamber intraocular lens; IOL intraocular lens; PCIOL posterior chamber intraocular lens; Phaco/IOL phacoemulsification with intraocular lens placement; [place] photorefractive keratectomy; LASIK laser assisted in situ keratomileusis; HTN hypertension; DM diabetes mellitus; COPD chronic obstructive pulmonary disease

## 2020-08-04 ENCOUNTER — Ambulatory Visit (INDEPENDENT_AMBULATORY_CARE_PROVIDER_SITE_OTHER): Payer: BC Managed Care – PPO | Admitting: Ophthalmology

## 2020-08-04 ENCOUNTER — Encounter (INDEPENDENT_AMBULATORY_CARE_PROVIDER_SITE_OTHER): Payer: Self-pay | Admitting: Ophthalmology

## 2020-08-04 ENCOUNTER — Other Ambulatory Visit: Payer: Self-pay

## 2020-08-04 DIAGNOSIS — H35413 Lattice degeneration of retina, bilateral: Secondary | ICD-10-CM

## 2020-08-04 DIAGNOSIS — H3581 Retinal edema: Secondary | ICD-10-CM | POA: Diagnosis not present

## 2020-08-04 DIAGNOSIS — H5213 Myopia, bilateral: Secondary | ICD-10-CM

## 2020-08-04 DIAGNOSIS — H52203 Unspecified astigmatism, bilateral: Secondary | ICD-10-CM

## 2020-08-04 DIAGNOSIS — H33323 Round hole, bilateral: Secondary | ICD-10-CM

## 2020-08-11 DIAGNOSIS — S63501A Unspecified sprain of right wrist, initial encounter: Secondary | ICD-10-CM | POA: Diagnosis not present

## 2020-08-18 DIAGNOSIS — S63501D Unspecified sprain of right wrist, subsequent encounter: Secondary | ICD-10-CM | POA: Diagnosis not present

## 2020-08-18 DIAGNOSIS — S63501A Unspecified sprain of right wrist, initial encounter: Secondary | ICD-10-CM | POA: Diagnosis not present

## 2020-09-15 DIAGNOSIS — S63501D Unspecified sprain of right wrist, subsequent encounter: Secondary | ICD-10-CM | POA: Diagnosis not present

## 2020-09-15 DIAGNOSIS — M6530 Trigger finger, unspecified finger: Secondary | ICD-10-CM | POA: Diagnosis not present

## 2020-10-31 NOTE — Progress Notes (Signed)
[facility] Note  11/03/2020     CHIEF COMPLAINT Patient presents for Retina Follow Up   HISTORY OF PRESENT ILLNESS: Andrea Perkins is a 39 y.o. female who presents to the clinic today for:   HPI    Retina Follow Up    Patient presents with  Other.  In both eyes.  This started months ago.  Severity is mild.  Duration of 3 months.  Since onset it is stable.  I, the attending physician,  performed the HPI with the patient and updated documentation appropriately.          Comments    39 y/o female pt here for 3 mo f/u for lattice OU.  S/p laser retinopexy OU August 2021.  No change in [place] OU, but pt has noticed more intense photophobia over the last few mos, and is having more frequent migraines.  No gtts.       Last edited by Bernarda Caffey, MD on 11/03/2020 12:53 PM. (History)    pt states she has been having a lot of light sensitivity at night, she states even if her husband is driving, she feels like she needs to wear sunglasses, she states she has had 3 migraines in the past 3 months, and she does not usually get headaches, no other health concerns  Referring physician: Dr. Anell Barr, [place] [id number]  HISTORICAL INFORMATION:   Selected notes from the MEDICAL RECORD NUMBER Referred by Dr. Ellin Mayhew LEE: 07.14.21 BCVA: 20/20 OU Ocular Hx- Atrophic Retinal Hole OD     CURRENT MEDICATIONS: No current outpatient medications on file. (Ophthalmic Drugs)   No current facility-administered medications for this visit. (Ophthalmic Drugs)   No current outpatient medications on file. (Other)   No current facility-administered medications for this visit. (Other)      REVIEW OF SYSTEMS: ROS    Positive for: Eyes   Negative for: Constitutional, Gastrointestinal, Neurological, Skin, Genitourinary, Musculoskeletal, HENT, Endocrine, Cardiovascular, Respiratory, Psychiatric, Allergic/Imm, Heme/Lymph   Last edited by Matthew Folks, COA on 11/03/2020  9:42 AM. (History)       ALLERGIES Allergies  Allergen Reactions  . Amoxicillin Anaphylaxis and Rash    this medication makes pt's ears itch.  . Amoxicillin Itching  . Lactulose Other (See Comments)  . Milk-Related Compounds Hives    Along with congestion  . Polysporin [Bacitracin-Polymyxin B] Itching  . Gold-Containing Drug Products Rash    PAST MEDICAL HISTORY Past Medical History:  Diagnosis Date  . Atypical mole 01/14/2016   R prox ant lateral thigh/mild  . Chicken pox   . H/O pulmonary embolus during pregnancy 09/22/2015  . Pulmonary embolism affecting pregnancy 3/16  . Pulmonary embolism affecting pregnancy 4/16   Past Surgical History:  Procedure Laterality Date  . VAGINAL DELIVERY     x 3  . WISDOM TOOTH EXTRACTION    . WISDOM TOOTH EXTRACTION      FAMILY HISTORY Family History  Problem Relation Age of Onset  . Hypertension Mother   . Hyperlipidemia Mother   . Cancer Mother 29s       Breast  . Thyroid disease Sister   . Alcohol abuse Brother   . Drug abuse Brother   . Cancer Brother        "pleural" cancer  . Alcohol abuse Maternal Grandfather   . Drug abuse Maternal Grandfather   . Mental illness Maternal Grandfather        ?  bipolar    SOCIAL HISTORY Social History   Tobacco Use  . Smoking status: Never Smoker  . Smokeless tobacco: Never Used  Substance Use Topics  . Alcohol use: No    Alcohol/week: 0.0 standard drinks  . Drug use: No         OPHTHALMIC EXAM:  Base Eye Exam    Visual Acuity (Snellen - Linear)      Right Left   Dist cc 20/15 - 20/15 -2   Correction: Glasses       Tonometry (Tonopen, 9:44 AM)      Right Left   Pressure 12 13       Pupils      Dark Light Shape React APD   Right 3 2 Round Brisk None   Left 3 2 Round Brisk None       Visual Fields (Counting fingers)      Left Right    Full Full       Extraocular Movement      Right Left    Full, Ortho Full, Ortho        Neuro/Psych    Oriented x3: Yes   Mood/Affect: Normal       Dilation    Both eyes: 1.0% Mydriacyl, 2.5% Phenylephrine @ 9:44 AM        Slit Lamp and Fundus Exam    Slit Lamp Exam      Right Left   Lids/Lashes Normal Normal   Conjunctiva/Sclera White and quiet White and quiet   Cornea Trace Punctate epithelial erosions Trace Punctate epithelial erosions, trace, fine endo pigment   Anterior Chamber Deep and quiet, no cell or flare Deep and quiet, 0.5+pigment   Iris Round and dilated Round and dilated   Lens Clear Clear   Vitreous Mild Vitreous syneresis Mild Vitreous syneresis       Fundus Exam      Right Left   Disc Pink and Sharp Pink and Sharp   C/D Ratio 0.4 0.3   Macula Flat, Good foveal reflex, mild RPE mottling, No heme or edema Flat, Good foveal reflex, mild RPE mottling, No heme or edema   Vessels mild attenuation, mild tortuousity mild attenuation, mild tortuousity   Periphery Attached, focal patch of lattice at 1030, focal patch of fine lattice at 0700, pigmented lattice at 0730, operculated hole at 0800; good laser surrounding all lesions, No new RT/RD Attached, round hole at 1230, focal pigment clump w/ adjacent hole at 0500, lattice with atrophic hole at 0600, good laser surrounding all lesions, mild cystoid degeneration, No new RT/RD          IMAGING AND PROCEDURES  Imaging and Procedures for 11/03/2020  OCT, Retina - OU - Both Eyes       Right Eye Quality was good. Central Foveal Thickness: 285. Progression has been stable. Findings include normal foveal contour, no IRF, no SRF, vitreomacular adhesion .   Left Eye Quality was good. Central Foveal Thickness: 287. Progression has been stable. Findings include normal foveal contour, no IRF, no SRF, vitreomacular adhesion .   Notes *Images captured and stored on drive  Diagnosis / Impression:  NFP, No IRF/SRF OU  Clinical management:  See below  Abbreviations: NFP - Normal foveal profile. CME -  cystoid macular edema. PED - pigment epithelial detachment. IRF - intraretinal fluid. SRF - subretinal fluid. EZ - ellipsoid zone. ERM - epiretinal membrane. ORA - outer retinal atrophy. ORT - outer retinal tubulation. SRHM - subretinal hyper-reflective material.  IRHM - intraretinal hyper-reflective material                ASSESSMENT/PLAN:    ICD-10-CM   1. 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 Retinal hole of both eyes  H33.323   3. Retinal edema  H35.81 OCT, Retina - OU - Both Eyes  4. Myopia of both eyes with astigmatism  H52.13    H52.203    1,2. Lattice degeneration, both eyes - OD: focal patch of lattice at 1100, focal patch of fine lattice at 0700, pigmented lattice at 0730, operculated hole at 0800 - OS: round hole at 1230, lattice with atrophic hole at 0600 - S/p laser retinopexy OD 08.04.21 -- good laser in place - S/p laser retinopexy OS 08.11.21 -- good laser in place - pt is cleared from a retina standpoint for release to Dr. Ellin Mayhew and resumption of primary eye care  3. No retinal edema on exam or OCT  4. Myopia w/ astigmatism OU  - discussed link between high myopia and lattice degeneration, discussed increased risk of RT/RD    Ophthalmic Meds Ordered this visit:  No orders of the defined types were placed in this encounter.      Return if symptoms worsen or fail to improve.  There are no Patient Instructions on file for this visit.   Explained the diagnoses, plan, and follow up with the patient and they expressed understanding.  Patient expressed understanding of the importance of proper follow up care.   This document serves as a record of services personally performed by Gardiner Sleeper, MD, PhD. It was created on their behalf by Roselee Nova, COMT. The creation of this record is the provider's dictation and/or activities during the visit.  Electronically signed by: Roselee Nova, COMT 11/03/20 12:54 PM   This document serves as a  record of services personally performed by Gardiner Sleeper, MD, PhD. It was created on their behalf by San Jetty. Owens Shark, OA an ophthalmic technician. The creation of this record is the provider's dictation and/or activities during the visit.    Electronically signed by: San Jetty. Owens Shark, [place] 11.30.2021 12:54 PM  Gardiner Sleeper, M.D., Ph.D. Diseases & Surgery of the Retina and Vitreous [facility]  I have reviewed the above documentation for accuracy and completeness, and I agree with the above. Gardiner Sleeper, M.D., Ph.D. 11/03/20 12:54 PM   Abbreviations: M myopia (nearsighted); A astigmatism; H hyperopia (farsighted); P presbyopia; Mrx spectacle prescription;  CTL contact lenses; OD right eye; OS left eye; OU both eyes  XT exotropia; ET esotropia; PEK punctate epithelial keratitis; PEE punctate epithelial erosions; DES dry eye syndrome; MGD meibomian gland dysfunction; ATs artificial tears; PFAT's preservative free artificial tears; [place] nuclear sclerotic cataract; PSC posterior subcapsular cataract; ERM epi-retinal membrane; PVD posterior vitreous detachment; RD retinal detachment; DM diabetes mellitus; DR diabetic retinopathy; NPDR non-proliferative diabetic retinopathy; PDR proliferative diabetic retinopathy; CSME clinically significant macular edema; DME diabetic macular edema; dbh dot blot hemorrhages; CWS cotton wool spot; POAG primary open angle glaucoma; C/D cup-to-disc ratio; HVF humphrey visual field; GVF goldmann visual field; OCT optical coherence tomography; IOP intraocular pressure; BRVO Branch retinal vein occlusion; CRVO central retinal vein occlusion; CRAO central retinal artery occlusion; BRAO branch retinal artery occlusion; RT retinal tear; SB scleral buckle; PPV pars plana vitrectomy; VH Vitreous hemorrhage; PRP panretinal laser photocoagulation; IVK intravitreal kenalog; VMT vitreomacular traction; MH Macular hole;  NVD neovascularization of the disc; NVE  neovascularization  elsewhere; AREDS age related eye disease study; ARMD age related macular degeneration; POAG primary open angle glaucoma; EBMD epithelial/anterior basement membrane dystrophy; ACIOL anterior chamber intraocular lens; IOL intraocular lens; PCIOL posterior chamber intraocular lens; Phaco/IOL phacoemulsification with intraocular lens placement; [place] photorefractive keratectomy; LASIK laser assisted in situ keratomileusis; HTN hypertension; DM diabetes mellitus; COPD chronic obstructive pulmonary disease

## 2020-11-03 ENCOUNTER — Encounter (INDEPENDENT_AMBULATORY_CARE_PROVIDER_SITE_OTHER): Payer: Self-pay | Admitting: Ophthalmology

## 2020-11-03 ENCOUNTER — Other Ambulatory Visit: Payer: Self-pay

## 2020-11-03 ENCOUNTER — Ambulatory Visit (INDEPENDENT_AMBULATORY_CARE_PROVIDER_SITE_OTHER): Payer: BC Managed Care – PPO | Admitting: Ophthalmology

## 2020-11-03 DIAGNOSIS — H3581 Retinal edema: Secondary | ICD-10-CM

## 2020-11-03 DIAGNOSIS — H35413 Lattice degeneration of retina, bilateral: Secondary | ICD-10-CM | POA: Diagnosis not present

## 2020-11-03 DIAGNOSIS — H5213 Myopia, bilateral: Secondary | ICD-10-CM | POA: Diagnosis not present

## 2020-11-03 DIAGNOSIS — H33323 Round hole, bilateral: Secondary | ICD-10-CM | POA: Diagnosis not present

## 2020-11-03 DIAGNOSIS — H52203 Unspecified astigmatism, bilateral: Secondary | ICD-10-CM

## 2021-01-05 DIAGNOSIS — L578 Other skin changes due to chronic exposure to nonionizing radiation: Secondary | ICD-10-CM | POA: Diagnosis not present

## 2021-01-05 DIAGNOSIS — D229 Melanocytic nevi, unspecified: Secondary | ICD-10-CM | POA: Diagnosis not present

## 2021-01-27 ENCOUNTER — Other Ambulatory Visit: Payer: Self-pay | Admitting: Internal Medicine

## 2021-02-10 ENCOUNTER — Ambulatory Visit: Payer: BC Managed Care – PPO | Admitting: Obstetrics and Gynecology

## 2021-02-18 ENCOUNTER — Other Ambulatory Visit: Payer: Self-pay

## 2021-02-18 ENCOUNTER — Ambulatory Visit (INDEPENDENT_AMBULATORY_CARE_PROVIDER_SITE_OTHER): Payer: BC Managed Care – PPO | Admitting: Obstetrics and Gynecology

## 2021-02-18 ENCOUNTER — Encounter: Payer: Self-pay | Admitting: Obstetrics and Gynecology

## 2021-02-18 VITALS — BP 116/70 | Ht 64.0 in | Wt 112.0 lb

## 2021-02-18 DIAGNOSIS — Z130 Encounter for screening for diseases of the blood and blood-forming organs and certain disorders involving the immune mechanism: Secondary | ICD-10-CM

## 2021-02-18 DIAGNOSIS — Z13220 Encounter for screening for lipoid disorders: Secondary | ICD-10-CM

## 2021-02-18 DIAGNOSIS — Z0000 Encounter for general adult medical examination without abnormal findings: Secondary | ICD-10-CM

## 2021-02-18 DIAGNOSIS — Z1231 Encounter for screening mammogram for malignant neoplasm of breast: Secondary | ICD-10-CM

## 2021-02-18 DIAGNOSIS — Z1239 Encounter for other screening for malignant neoplasm of breast: Secondary | ICD-10-CM

## 2021-02-18 DIAGNOSIS — Z01419 Encounter for gynecological examination (general) (routine) without abnormal findings: Secondary | ICD-10-CM | POA: Diagnosis not present

## 2021-02-18 DIAGNOSIS — R5383 Other fatigue: Secondary | ICD-10-CM

## 2021-02-18 DIAGNOSIS — Z8349 Family history of other endocrine, nutritional and metabolic diseases: Secondary | ICD-10-CM

## 2021-02-18 DIAGNOSIS — Z131 Encounter for screening for diabetes mellitus: Secondary | ICD-10-CM

## 2021-02-18 DIAGNOSIS — Z1329 Encounter for screening for other suspected endocrine disorder: Secondary | ICD-10-CM

## 2021-02-18 NOTE — Progress Notes (Signed)
Gynecology Annual Exam  PCP: Venia Carbon, MD  Chief Complaint:  Chief Complaint  Patient presents with  . Gynecologic Exam    History of Present Illness: Patient is a 40 y.o. [id number] presents for annual exam. The patient has no complaints today.   LMP: Patient's last menstrual period was 02/05/2021. Average Interval: 30 days, monthly Duration of flow: 5 days Heavy Menses: yes - reports 1st day is usually heavy, the other days lighter, not problematic Intermenstrual Bleeding: no Dysmenorrhea: no  The patient does perform self breast exams.  There is notable family history of breast or ovarian cancer in her family.  The patient reports her exercise generally consists of yoga and walking 3 days a week .  The patient denies current symptoms of depression.   PHQ-9: 3 GAD-7: 0   Review of Systems: ROS  Past Medical History:  Past Medical History:  Diagnosis Date  . Atypical mole 01/14/2016   R prox ant lateral thigh/mild  . Chicken pox   . H/O pulmonary embolus during pregnancy 09/22/2015  . Pulmonary embolism affecting pregnancy 3/16  . Pulmonary embolism affecting pregnancy 4/16    Past Surgical History:  Past Surgical History:  Procedure Laterality Date  . VAGINAL DELIVERY     x 3  . WISDOM TOOTH EXTRACTION    . WISDOM TOOTH EXTRACTION      Gynecologic History:  Patient's last menstrual period was 02/05/2021. Menarche: 15  History of fibroids, polyps, or ovarian cysts? : no  History of PCOS? no Hstory of Endometriosis? no History of abnormal pap smears? no Have you had any sexually transmitted infections in the past? no  She denies HPV vaccination in the past. She is not interested in HPV vaccination today.   Last Pap: Results were: 10/12/2018 NIL HPV negative    She identifies as a female. She is sexually active with men.   She denies dyspareunia. She denies postcoital bleeding.  She currently uses condoms for contraception.    Obstetric  History: [id number]  Family History:  Family History  Problem Relation Age of Onset  . Hypertension Mother   . Hyperlipidemia Mother   . Cancer Mother 27s       Breast  . Thyroid disease Sister   . Alcohol abuse Brother   . Drug abuse Brother   . Cancer Brother        "pleural" cancer  . Alcohol abuse Maternal Grandfather   . Drug abuse Maternal Grandfather   . Mental illness Maternal Grandfather        ?bipolar    Social History:  Social History   Socioeconomic History  . Marital status: Married    Spouse name: Not on file  . Number of children: 4  . Years of education: Not on file  . Highest education level: Not on file  Occupational History  . Occupation: Stay at home mom  Tobacco Use  . Smoking status: Never Smoker  . Smokeless tobacco: Never Used  Substance and Sexual Activity  . Alcohol use: No    Alcohol/week: 0.0 standard drinks  . Drug use: No  . Sexual activity: Yes    Birth control/protection: Condom  Other Topics Concern  . Not on file  Social History Narrative   Husband teaches finance at [employer] Strain: Not on file  Food Insecurity: Not on file  Transportation Needs: Not on file  Physical Activity: Not on file  Stress:  Not on file  Social Connections: Not on file  Intimate Partner Violence: Not on file    Allergies:  Allergies  Allergen Reactions  . Amoxicillin Anaphylaxis and Rash    this medication makes pt's ears itch.  . Amoxicillin Itching  . Lactulose Other (See Comments)  . Milk-Related Compounds Hives    Along with congestion  . Polysporin [Bacitracin-Polymyxin B] Itching  . Gold-Containing Drug Products Rash    Medications: Prior to Admission medications   Not on File    Physical Exam Vitals: currently breastfeeding.  Physical Exam Constitutional:      Appearance: She is well-developed.  Genitourinary:     Genitourinary Comments: External: Normal appearing vulva. No  lesions noted.   Bimanual examination: Uterus midline, non-tender, normal in size, shape and contour.  No CMT. No adnexal masses. No adnexal tenderness. Pelvis not fixed.  Breast Exam: breast equal without skin changes, nipple discharge, breast lump or enlarged lymph nodes   HENT:     Head: Normocephalic and atraumatic.  Neck:     Thyroid: No thyromegaly.  Cardiovascular:     Rate and Rhythm: Normal rate and regular rhythm.     Heart sounds: Normal heart sounds.  Pulmonary:     Effort: Pulmonary effort is normal.     Breath sounds: Normal breath sounds.  Abdominal:     General: Bowel sounds are normal. There is no distension.     Palpations: Abdomen is soft. There is no mass.  Musculoskeletal:     Cervical back: Neck supple.  Neurological:     Mental Status: She is alert and oriented to person, place, and time.  Skin:    General: Skin is warm and dry.  Psychiatric:        Behavior: Behavior normal.        Thought Content: Thought content normal.        Judgment: Judgment normal.  Vitals reviewed.      Female chaperone present for pelvic and breast  portions of the physical exam  Assessment: 40 y.o. [id number] routine annual exam  Plan: Problem List Items Addressed This Visit   None   Visit Diagnoses    Encounter for annual routine gynecological examination    -  Primary   Health maintenance examination       Encounter for screening breast examination       Relevant Orders   MM 3D SCREEN BREAST BILATERAL   Encounter for gynecological examination without abnormal finding       Breast cancer screening by mammogram       Relevant Orders   MM 3D SCREEN BREAST BILATERAL      1) STI screening was offered and declined  2) ASCCP guidelines and rational discussed.  Patient opts for every 5 years screening interval  3) Contraception - happy with condoms  4) Routine healthcare maintenance including cholesterol, diabetes screening discussed- Ordered today, patient will  complete in future  5) Mammogram ordered to start in fall.   Adrian Prows MD, [facility], [facility] 02/18/2021 11:10 AM

## 2021-02-18 NOTE — Patient Instructions (Signed)
Institute of Medicine Recommended Dietary Allowances for Calcium and Vitamin D  Age (yr) Calcium Recommended Dietary Allowance (mg/day) Vitamin D Recommended Dietary Allowance (international units/day)  9-18 1,300 600  19-50 1,000 600  51-70 1,200 600  71 and older 1,200 800  Data from Institute of Medicine. Dietary reference intakes: calcium, vitamin D. [place], [place]: National Academies Press; 2011.    Exercising to Stay Healthy To become healthy and stay healthy, it is recommended that you do moderate-intensity and vigorous-intensity exercise. You can tell that you are exercising at a moderate intensity if your heart starts beating faster and you start breathing faster but can still hold a conversation. You can tell that you are exercising at a vigorous intensity if you are breathing much harder and faster and cannot hold a conversation while exercising. Exercising regularly is important. It has many health benefits, such as:  Improving overall fitness, flexibility, and endurance.  Increasing bone density.  Helping with weight control.  Decreasing body fat.  Increasing muscle strength.  Reducing stress and tension.  Improving overall health. How often should I exercise? Choose an activity that you enjoy, and set realistic goals. Your health care provider can help you make an activity plan that works for you. Exercise regularly as told by your health care provider. This may include:  Doing strength training two times a week, such as: ? Lifting weights. ? Using resistance bands. ? Push-ups. ? Sit-ups. ? Yoga.  Doing a certain intensity of exercise for a given amount of time. Choose from these options: ? A total of 150 minutes of moderate-intensity exercise every week. ? A total of 75 minutes of vigorous-intensity exercise every week. ? A mix of moderate-intensity and vigorous-intensity exercise every week. Children, pregnant women, people who have not exercised  regularly, people who are overweight, and older adults may need to talk with a health care provider about what activities are safe to do. If you have a medical condition, be sure to talk with your health care provider before you start a new exercise program. What are some exercise ideas? Moderate-intensity exercise ideas include:  Walking 1 mile (1.6 km) in about 15 minutes.  Biking.  Hiking.  Golfing.  Dancing.  Water aerobics. Vigorous-intensity exercise ideas include:  Walking 4.5 miles (7.2 km) or more in about 1 hour.  Jogging or running 5 miles (8 km) in about 1 hour.  Biking 10 miles (16.1 km) or more in about 1 hour.  Lap swimming.  Roller-skating or in-line skating.  Cross-country skiing.  Vigorous competitive sports, such as football, basketball, and soccer.  Jumping rope.  Aerobic dancing.   What are some everyday activities that can help me to get exercise?  Yard work, such as: ? Pushing a lawn mower. ? Raking and bagging leaves.  Washing your car.  Pushing a stroller.  Shoveling snow.  Gardening.  Washing windows or floors. How can I be more active in my day-to-day activities?  Use stairs instead of an elevator.  Take a walk during your lunch break.  If you drive, park your car farther away from your work or school.  If you take public transportation, get off one stop early and walk the rest of the way.  Stand up or walk around during all of your indoor phone calls.  Get up, stretch, and walk around every 30 minutes throughout the day.  Enjoy exercise with a friend. Support to continue exercising will help you keep a regular routine of activity. What guidelines   can I follow while exercising?  Before you start a new exercise program, talk with your health care provider.  Do not exercise so much that you hurt yourself, feel dizzy, or get very short of breath.  Wear comfortable clothes and wear shoes with good support.  Drink plenty of  water while you exercise to prevent dehydration or heat stroke.  Work out until your breathing and your heartbeat get faster. Where to find more information  U.S. Department of Health and Human Services: www.hhs.gov  Centers for Disease Control and Prevention (CDC): www.cdc.gov Summary  Exercising regularly is important. It will improve your overall fitness, flexibility, and endurance.  Regular exercise also will improve your overall health. It can help you control your weight, reduce stress, and improve your bone density.  Do not exercise so much that you hurt yourself, feel dizzy, or get very short of breath.  Before you start a new exercise program, talk with your health care provider. This information is not intended to replace advice given to you by your health care provider. Make sure you discuss any questions you have with your health care provider. Document Revised: 11/03/2017 Document Reviewed: 10/12/2017 Elsevier Patient Education  2021 Elsevier Inc.   Budget-Friendly Healthy Eating There are many ways to save money at the grocery store and continue to eat healthy. You can be successful if you:  Plan meals according to your budget.  Make a grocery list and only purchase food according to your grocery list.  Prepare food yourself at home. What are tips for following this plan? Reading food labels  Compare food labels between brand name foods and the store brand. Often the nutritional value is the same, but the store brand is lower cost.  Look for products that do not have added sugar, fat, or salt (sodium). These often cost the same but are healthier for you. Products may be labeled as: ? Sugar-free. ? Nonfat. ? Low-fat. ? Sodium-free. ? Low-sodium.  Look for lean ground beef labeled as at least 92% lean and 8% fat. Shopping  Buy only the items on your grocery list and go only to the areas of the store that have the items on your list.  Use coupons only for  foods and brands you normally buy. Avoid buying items you wouldn't normally buy simply because they are on sale.  Check online and in newspapers for weekly deals.  Buy healthy items from the bulk bins when available, such as herbs, spices, flour, pasta, nuts, and dried fruit.  Buy fruits and vegetables that are in season. Prices are usually lower on in-season produce.  Look at the unit price on the price tag. Use it to compare different brands and sizes to find out which item is the best deal.  Choose healthy items that are often low-cost, such as carrots, potatoes, apples, bananas, and oranges. Dried or canned beans are a low-cost protein source.  Buy in bulk and freeze extra food. Items you can buy in bulk include meats, fish, poultry, frozen fruits, and frozen vegetables.  Avoid buying "ready-to-eat" foods, such as pre-cut fruits and vegetables and pre-made salads.  If possible, shop around to discover where you can find the best prices. Consider other retailers such as dollar stores, larger wholesale stores, local fruit and vegetable stands, and farmers markets.  Do not shop when you are hungry. If you shop while hungry, it may be hard to stick to your list and budget.  Resist impulse buying. Use your grocery   list as your official plan for the week.  Buy a variety of vegetables and fruits by purchasing fresh, frozen, and canned items.  Look at the top and bottom shelves for deals. Foods at eye level (eye level of an adult or child) are usually more expensive.  Be efficient with your time when shopping. The more time you spend at the store, the more money you are likely to spend.  To save money when choosing more expensive foods like meats and dairy: ? Choose cheaper cuts of meat, such as bone-in chicken thighs and drumsticks instead of skinless and boneless chicken. When you are ready to prepare the chicken, you can remove the skin yourself to make it healthier. ? Choose lean meats  like chicken or turkey instead of beef. ? Choose canned seafood, such as tuna, salmon, or sardines. ? Buy eggs as a low-cost source of protein. ? Buy dried beans and peas, such as lentils, split peas, or kidney beans instead of meats. Dried beans and peas are a good alternative source of protein. ? Buy the larger tubs of yogurt instead of individual-sized containers.  Choose water instead of sodas and other sweetened beverages.  Avoid buying chips, cookies, and other "junk food." These items are usually expensive and not healthy.   Cooking  Make extra food and freeze the extras in meal-sized containers or in individual portions for fast meals and snacks.  Pre-cook on days when you have extra time to prepare meals in advance. You can keep these meals in the fridge or freezer and reheat for a quick meal.  When you come home from the grocery store, wash, peel, and cut fruits and vegetables so they are ready to use and eat. This will help reduce food waste. Meal planning  Do not eat out or get fast food. Prepare food at home.  Make a grocery list and make sure to bring it with you to the store. If you have a smart phone, you could use your phone to create your shopping list.  Plan meals and snacks according to a grocery list and budget you create.  Use leftovers in your meal plan for the week.  Look for recipes where you can cook once and make enough food for two meals.  Prepare budget-friendly types of meals like stews, casseroles, and stir-fry dishes.  Try some meatless meals or try "no cook" meals like salads.  Make sure that half your plate is filled with fruits or vegetables. Choose from fresh, frozen, or canned fruits and vegetables. If eating canned, remember to rinse them before eating. This will remove any excess salt added for packaging. Summary  Eating healthy on a budget is possible if you plan your meals according to your budget, purchase according to your budget and  grocery list, and prepare food yourself.  Tips for buying more food on a limited budget include buying generic brands, using coupons only for foods you normally buy, and buying healthy items from the bulk bins when available.  Tips for buying cheaper food to replace expensive food include choosing cheaper, lean cuts of meat, and buying dried beans and peas. This information is not intended to replace advice given to you by your health care provider. Make sure you discuss any questions you have with your health care provider. Document Revised: 09/03/2020 Document Reviewed: 09/03/2020 Elsevier Patient Education  2021 Elsevier Inc.   Bone Health Bones protect organs, store calcium, anchor muscles, and support the whole body. Keeping your bones   strong is important, especially as you get older. You can take actions to help keep your bones strong and healthy. Why is keeping my bones healthy important? Keeping your bones healthy is important because your body constantly replaces bone cells. Cells get old, and new cells take their place. As we age, we lose bone cells because the body may not be able to make enough new cells to replace the old cells. The amount of bone cells and bone tissue you have is referred to as bone mass. The higher your bone mass, the stronger your bones. The aging process leads to an overall loss of bone mass in the body, which can increase the likelihood of:  Joint pain and stiffness.  Broken bones.  A condition in which the bones become weak and brittle (osteoporosis). A large decline in bone mass occurs in older adults. In women, it occurs about the time of menopause.   What actions can I take to keep my bones healthy? Good health habits are important for maintaining healthy bones. This includes eating nutritious foods and exercising regularly. To have healthy bones, you need to get enough of the right minerals and vitamins. Most nutrition experts recommend getting these  nutrients from the foods that you eat. In some cases, taking supplements may also be recommended. Doing certain types of exercise is also important for bone health. What are the nutritional recommendations for healthy bones? Eating a well-balanced diet with plenty of calcium and vitamin D will help to protect your bones. Nutritional recommendations vary from person to person. Ask your health care provider what is healthy for you. Here are some general guidelines. Get enough calcium Calcium is the most important (essential) mineral for bone health. Most people can get enough calcium from their diet, but supplements may be recommended for people who are at risk for osteoporosis. Good sources of calcium include:  Dairy products, such as low-fat or nonfat milk, cheese, and yogurt.  Dark green leafy vegetables, such as bok choy and broccoli.  Calcium-fortified foods, such as orange juice, cereal, bread, soy beverages, and tofu products.  Nuts, such as almonds. Follow these recommended amounts for daily calcium intake:  Children, age 1-3: 700 mg.  Children, age 4-8: 1,000 mg.  Children, age 9-13: 1,300 mg.  Teens, age 14-18: 1,300 mg.  Adults, age 19-50: 1,000 mg.  Adults, age 51-70: ? Men: 1,000 mg. ? Women: 1,200 mg.  Adults, age 71 or older: 1,200 mg.  Pregnant and breastfeeding females: ? Teens: 1,300 mg. ? Adults: 1,000 mg. Get enough vitamin D Vitamin D is the most essential vitamin for bone health. It helps the body absorb calcium. Sunlight stimulates the skin to make vitamin D, so be sure to get enough sunlight. If you live in a cold climate or you do not get outside often, your health care provider may recommend that you take vitamin D supplements. Good sources of vitamin D in your diet include:  Egg yolks.  Saltwater fish.  Milk and cereal fortified with vitamin D. Follow these recommended amounts for daily vitamin D intake:  Children and teens, age 1-18: 600  international units.  Adults, age 50 or younger: 400-800 international units.  Adults, age 51 or older: 800-1,000 international units. Get other important nutrients Other nutrients that are important for bone health include:  Phosphorus. This mineral is found in meat, poultry, dairy foods, nuts, and legumes. The recommended daily intake for adult men and adult women is 700 mg.  Magnesium. This mineral   is found in seeds, nuts, dark green vegetables, and legumes. The recommended daily intake for adult men is 400-420 mg. For adult women, it is 310-320 mg.  Vitamin K. This vitamin is found in green leafy vegetables. The recommended daily intake is 120 mg for adult men and 90 mg for adult women.   What type of physical activity is best for building and maintaining healthy bones? Weight-bearing and strength-building activities are important for building and maintaining healthy bones. Weight-bearing activities cause muscles and bones to work against gravity. Strength-building activities increase the strength of the muscles that support bones. Weight-bearing and muscle-building activities include:  Walking and hiking.  Jogging and running.  Dancing.  Gym exercises.  Lifting weights.  Tennis and racquetball.  Climbing stairs.  Aerobics. Adults should get at least 30 minutes of moderate physical activity on most days. Children should get at least 60 minutes of moderate physical activity on most days. Ask your health care provider what type of exercise is best for you.   How can I find out if my bone mass is low? Bone mass can be measured with an X-ray test called a bone mineral density (BMD) test. This test is recommended for all women who are age 65 or older. It may also be recommended for:  Men who are age 70 or older.  People who are at risk for osteoporosis because of: ? Having bones that break easily. ? Having a long-term disease that weakens bones, such as kidney disease or  rheumatoid arthritis. ? Having menopause earlier than normal. ? Taking medicine that weakens bones, such as steroids, thyroid hormones, or hormone treatment for breast cancer or prostate cancer. ? Smoking. ? Drinking three or more alcoholic drinks a day. If you find that you have a low bone mass, you may be able to prevent osteoporosis or further bone loss by changing your diet and lifestyle. Where can I find more information? For more information, check out the following websites:  National Osteoporosis Foundation: www.nof.org/patients  National Institutes of Health: www.bones.nih.gov  International Osteoporosis Foundation: www.iofbonehealth.org Summary  The aging process leads to an overall loss of bone mass in the body, which can increase the likelihood of broken bones and osteoporosis.  Eating a well-balanced diet with plenty of calcium and vitamin D will help to protect your bones.  Weight-bearing and strength-building activities are also important for building and maintaining strong bones.  Bone mass can be measured with an X-ray test called a bone mineral density (BMD) test. This information is not intended to replace advice given to you by your health care provider. Make sure you discuss any questions you have with your health care provider. Document Revised: 12/18/2017 Document Reviewed: 12/18/2017 Elsevier Patient Education  2021 Elsevier Inc.   

## 2021-02-24 ENCOUNTER — Encounter: Payer: Self-pay | Admitting: Obstetrics and Gynecology

## 2021-07-14 ENCOUNTER — Encounter: Payer: BC Managed Care – PPO | Admitting: Internal Medicine

## 2021-11-01 DIAGNOSIS — H5213 Myopia, bilateral: Secondary | ICD-10-CM | POA: Diagnosis not present

## 2022-04-07 ENCOUNTER — Other Ambulatory Visit: Payer: Self-pay | Admitting: Cardiology

## 2022-04-12 ENCOUNTER — Telehealth: Payer: Self-pay

## 2022-04-12 NOTE — Telephone Encounter (Signed)
Pt needs a annual scheduled please ?

## 2022-04-14 ENCOUNTER — Encounter: Payer: Self-pay | Admitting: Obstetrics

## 2022-04-14 ENCOUNTER — Other Ambulatory Visit: Payer: Self-pay | Admitting: Obstetrics

## 2022-04-14 ENCOUNTER — Ambulatory Visit (INDEPENDENT_AMBULATORY_CARE_PROVIDER_SITE_OTHER): Payer: BC Managed Care – PPO | Admitting: Obstetrics

## 2022-04-14 ENCOUNTER — Other Ambulatory Visit: Payer: Self-pay | Admitting: Internal Medicine

## 2022-04-14 VITALS — BP 120/80 | Ht 64.0 in | Wt 112.0 lb

## 2022-04-14 DIAGNOSIS — Z01419 Encounter for gynecological examination (general) (routine) without abnormal findings: Secondary | ICD-10-CM | POA: Diagnosis not present

## 2022-04-14 DIAGNOSIS — Z803 Family history of malignant neoplasm of breast: Secondary | ICD-10-CM

## 2022-04-14 DIAGNOSIS — Z1231 Encounter for screening mammogram for malignant neoplasm of breast: Secondary | ICD-10-CM | POA: Diagnosis not present

## 2022-04-14 NOTE — Progress Notes (Signed)
Chief Complaint  ?Patient presents with  ? Annual Exam  ? ?Patient Andrea Perkins is an 41 y.o. year old [id number] Patient's last menstrual period was 03/18/2022. currently condoms for contraception who presents for annual. Patient is exercising regularly. Patient does perform self breast exam. Patient with family history of breast cancer in mother. Patients mother s/p genetic testing, negative.  Patient takes no multivitamin. Patient is content with the birth control method. Patient is sexually active with no problems.  Patient denies  vasomotor symptoms, or vaginal dryness. Patient did have hormone labs from a naturopathic provider day 21 with elevated progesterone, normal estradiol. ? ?Primary care provider: Venia Carbon, MD ? ?Review of Systems  ?Constitutional:  Negative for activity change, appetite change, chills, diaphoresis, fatigue, fever and unexpected weight change.  ?HENT:  Negative for congestion, dental problem, drooling, ear discharge, ear pain, facial swelling, hearing loss, mouth sores, nosebleeds, postnasal drip, rhinorrhea, sinus pressure, sinus pain, sneezing, sore throat, tinnitus, trouble swallowing and voice change.   ?Eyes:  Negative for photophobia, pain, discharge, redness, itching and visual disturbance.  ?Respiratory:  Negative for apnea, cough, choking, chest tightness, shortness of breath, wheezing and stridor.   ?Cardiovascular:  Negative for chest pain, palpitations and leg swelling.  ?Gastrointestinal:  Negative for abdominal distention, abdominal pain, anal bleeding, blood in stool, constipation, diarrhea, nausea, rectal pain and vomiting.  ?Endocrine: Negative for cold intolerance, heat intolerance, polydipsia, polyphagia and polyuria.  ?Genitourinary:  Negative for decreased urine volume, difficulty urinating, dyspareunia, dysuria, enuresis, flank pain, frequency, genital sores, hematuria, menstrual problem, pelvic pain, urgency, vaginal bleeding, vaginal discharge and  vaginal pain.  ?Musculoskeletal:  Positive for arthralgias. Negative for back pain, gait problem, joint swelling, myalgias, neck pain and neck stiffness.  ?Skin:  Negative for color change, pallor, rash and wound.  ?Allergic/Immunologic: Negative for environmental allergies, food allergies and immunocompromised state.  ?Neurological:  Negative for dizziness, tremors, seizures, syncope, facial asymmetry, speech difficulty, weakness, light-headedness, numbness and headaches.  ?Hematological:  Negative for adenopathy. Does not bruise/bleed easily.  ?Psychiatric/Behavioral:  Negative for behavioral problems, confusion, decreased concentration, dysphoric mood, hallucinations, self-injury, sleep disturbance and suicidal ideas. The patient is not nervous/anxious and is not hyperactive.    ? ?Menstrual history: ?Menarche: 15 ?Period Cycle (Days): 28 ?Period Duration (Days): 5-6 ?Period Pattern: Regular ?Menstrual Flow: Heavy, Moderate, Light ?Dysmenorrhea: None ? ?Past Medical History:  ?Diagnosis Date  ? Atypical mole 01/14/2016  ? R prox ant lateral thigh/mild  ? Chicken pox   ? Family history of breast cancer   ? 3/22 cancer genetic testing letter sent  ? H/O pulmonary embolus during pregnancy 09/22/2015  ? Pulmonary embolism affecting pregnancy 3/16  ? Pulmonary embolism affecting pregnancy 4/16  ? ?Past Surgical History:  ?Procedure Laterality Date  ? VAGINAL DELIVERY    ? x 3  ? WISDOM TOOTH EXTRACTION    ? WISDOM TOOTH EXTRACTION    ? ?Family History  ?Problem Relation Age of Onset  ? Hypertension Mother   ? Hyperlipidemia Mother   ? Breast cancer Mother 100  ?     and age 39  ? Thyroid disease Sister   ? Alcohol abuse Brother   ? Drug abuse Brother   ? Cancer Brother   ?     "pleural" cancer  ? Alcohol abuse Maternal Grandfather   ? Drug abuse Maternal Grandfather   ? Mental illness Maternal Grandfather   ?     ?bipolar  ? ?Social History  ? ?Socioeconomic History  ?  Marital status: Married  ?  Spouse name: Not on  file  ? Number of children: 4  ? Years of education: Not on file  ? Highest education level: Not on file  ?Occupational History  ? Occupation: Stay at home mom  ?Tobacco Use  ? Smoking status: Never  ? Smokeless tobacco: Never  ?Substance and Sexual Activity  ? Alcohol use: No  ?  Alcohol/week: 0.0 standard drinks  ? Drug use: No  ? Sexual activity: Yes  ?  Birth control/protection: Condom  ?Other Topics Concern  ? Not on file  ?Social History Narrative  ? Husband teaches finance at [employer]  ? ?Social Determinants of Health  ? ?Financial Resource Strain: Not on file  ?Food Insecurity: Not on file  ?Transportation Needs: Not on file  ?Physical Activity: Not on file  ?Stress: Not on file  ?Social Connections: Not on file  ?Intimate Partner Violence: Not on file  ? ?Patient denies abnormal paps. Patient denies pelvic infections. Patient denies domestic violence or sexual abuse ?Patient has not received Gardisil series ?Health Maintenance  ?Topic Date Due  ? Hepatitis C Screening  Never done  ? COVID-19 Vaccine (2 - Booster for Janssen series) 05/08/2020  ? PAP SMEAR-Modifier  10/13/2023 (Originally 10/12/2021)  ? INFLUENZA VACCINE  07/05/2022  ? TETANUS/TDAP  07/09/2025  ? HIV Screening  Completed  ? HPV VACCINES  Aged Out  ? ?Medicine list and allergies reviewed and updated.   ?  ?Objective:  ?BP 120/80   Ht '5\' 4"'$  (1.626 m)   Wt 112 lb (50.8 kg)   LMP 03/18/2022   BMI 19.22 kg/m?  ? ?  04/14/2022  ?  1:55 PM 02/18/2021  ? 10:40 AM 11/03/2015  ?  9:38 AM  ?Depression screen PHQ 2/9  ?Decreased Interest 0 1 0  ?Down, Depressed, Hopeless 0 0 0  ?PHQ - 2 Score 0 1 0  ?Altered sleeping 1 0   ?Tired, decreased energy 1 0   ?Change in appetite 1 1   ?Feeling bad or failure about yourself  1 0   ?Trouble concentrating 2 1   ?Moving slowly or fidgety/restless 0 0   ?Suicidal thoughts 0 0   ?PHQ-9 Score 6 3   ?Difficult doing work/chores Not difficult at all Not difficult at all   ? ?Physical Exam ?Vitals and nursing note  reviewed. Exam conducted with a chaperone present.  ?Constitutional:   ?   General: She is not in acute distress. ?   Appearance: Normal appearance. She is not ill-appearing.  ?HENT:  ?   Head: Normocephalic and atraumatic.  ?   Mouth/Throat:  ?   Mouth: Mucous membranes are moist.  ?   Pharynx: No oropharyngeal exudate.  ?Eyes:  ?   Extraocular Movements: Extraocular movements intact.  ?Neck:  ?   Thyroid: No thyromegaly.  ?Cardiovascular:  ?   Rate and Rhythm: Normal rate and regular rhythm.  ?   Pulses: Normal pulses.  ?   Heart sounds: Normal heart sounds.  ?Pulmonary:  ?   Effort: Pulmonary effort is normal. No respiratory distress.  ?   Breath sounds: Normal breath sounds.  ?Chest:  ?   Chest wall: No mass or tenderness.  ?Breasts: ?   Breasts are symmetrical.  ?   Right: Normal. No mass, nipple discharge, skin change or tenderness.  ?   Left: Normal. No mass, nipple discharge, skin change or tenderness.  ?Abdominal:  ?   General: There is no  distension.  ?   Palpations: There is no mass.  ?   Tenderness: There is no abdominal tenderness. There is no rebound.  ?   Hernia: No hernia is present.  ?Genitourinary: ?   General: Normal vulva.  ?   Exam position: Lithotomy position.  ?   Pubic Area: No rash.   ?   Labia:     ?   Right: No rash, tenderness or lesion.     ?   Left: No rash, tenderness or lesion.   ?   Urethra: No urethral lesion.  ?   Vagina: Normal. No foreign body. No vaginal discharge, tenderness or lesions.  ?   Cervix: No discharge, friability, lesion, erythema or cervical bleeding.  ?   Uterus: Normal. Not enlarged, not tender and no uterine prolapse.   ?   Adnexa: Right adnexa normal and left adnexa normal.    ?   Right: No tenderness or fullness.      ?   Left: No tenderness or fullness.    ?Musculoskeletal:     ?   General: No tenderness. Normal range of motion.  ?   Cervical back: Normal range of motion. No tenderness.  ?   Right lower leg: No edema.  ?   Left lower leg: No edema.   ?Lymphadenopathy:  ?   Cervical: No cervical adenopathy.  ?   Upper Body:  ?   Right upper body: No axillary adenopathy.  ?   Left upper body: No axillary adenopathy.  ?   Lower Body: No right inguinal adenopathy. No left inguina

## 2022-04-14 NOTE — Patient Instructions (Signed)
Have a great year! Please call with any concerns. ?Don't forget to wear your seatbelt everyday! ?If you are not signed up on MyChart, please ask [place] how to sign up for it!  ? ?In a world where you can be anything, please be kind.  ? ?Body mass index is 19.22 kg/m?Marland Kitchen  ?A Healthy Lifestyle: Care Instructions ?Your Care Instructions ? ?A healthy lifestyle can help you feel good, stay at a healthy weight, and have plenty of energy for both work and play. A healthy lifestyle is something you can share with your whole family. ?A healthy lifestyle also can lower your risk for serious health problems, such as high blood pressure, heart disease, and diabetes. ?You can follow a few steps listed below to improve your health and the health of your family. ?Follow-up care is a key part of your treatment and safety. Be sure to make and go to all appointments, and call your doctor if you are having problems. It's also a good idea to know your test results and keep a list of the medicines you take. ?How can you care for yourself at home? ?Do not eat too much sugar, fat, or fast foods. You can still have dessert and treats now and then. The goal is moderation. ?Start small to improve your eating habits. Pay attention to portion sizes, drink less juice and soda pop, and eat more fruits and vegetables. ?Eat a healthy amount of food. A 3-ounce serving of meat, for example, is about the size of a deck of cards. Fill the rest of your plate with vegetables and whole grains. ?Limit the amount of soda and sports drinks you have every day. Drink more water when you are thirsty. ?Eat at least 5 servings of fruits and vegetables every day. It may seem like a lot, but it is not hard to reach this goal. A serving or helping is 1 piece of fruit, 1 cup of vegetables, or 2 cups of leafy, raw vegetables. Have an apple or some carrot sticks as an afternoon snack instead of a candy bar. Try to have fruits and/or vegetables at every meal. ?Make exercise  part of your daily routine. You may want to start with simple activities, such as walking, bicycling, or slow swimming. Try to be active 30 to 60 minutes every day. You do not need to do all 30 to 60 minutes all at once. For example, you can exercise 3 times a day for 10 or 20 minutes. Moderate exercise is safe for most people, but it is always a good idea to talk to your doctor before starting an exercise program. ?Keep moving. Mow the lawn, work in the garden, or [employer]. Take the stairs instead of the elevator at work. ?If you smoke, quit. People who smoke have an increased risk for heart attack, stroke, cancer, and other lung illnesses. Quitting is hard, but there are ways to boost your chance of quitting tobacco for good. ?Use nicotine gum, patches, or lozenges. ?Ask your doctor about stop-smoking programs and medicines. ?Keep trying. ?In addition to reducing your risk of diseases in the future, you will notice some benefits soon after you stop using tobacco. If you have shortness of breath or asthma symptoms, they will likely get better within a few weeks after you quit. ?Limit how much alcohol you drink. Moderate amounts of alcohol (up to 2 drinks a day for men, 1 drink a day for women) are okay. But drinking too much can lead to  liver problems, high blood pressure, and other health problems. ?Family health ?If you have a family, there are many things you can do together to improve your health. ?Eat meals together as a family as often as possible. ?Eat healthy foods. This includes fruits, vegetables, lean meats and dairy, and whole grains. ?Include your family in your fitness plan. Most people think of activities such as jogging or tennis as the way to fitness, but there are many ways you and your family can be more active. Anything that makes you breathe hard and gets your heart pumping is exercise. Here are some tips: ?Walk to do errands or to take your child to school or the bus. ?Go for a family  bike ride after dinner instead of watching TV. ?Care instructions adapted under license by your healthcare professional. This care instruction is for use with your licensed healthcare professional. If you have questions about a medical condition or this instruction, always ask your healthcare professional. [place] any warranty or liability for your use of this information. ? ?

## 2022-04-15 NOTE — Progress Notes (Signed)
Called pt no answer, [facility]. ?

## 2022-05-16 ENCOUNTER — Ambulatory Visit
Admission: RE | Admit: 2022-05-16 | Discharge: 2022-05-16 | Disposition: A | Discharge status: Home / Self Care | Payer: BC Managed Care – PPO | Source: Ambulatory Visit | Attending: Obstetrics | Admitting: Obstetrics

## 2022-05-16 DIAGNOSIS — Z1231 Encounter for screening mammogram for malignant neoplasm of breast: Secondary | ICD-10-CM | POA: Insufficient documentation

## 2022-05-17 ENCOUNTER — Other Ambulatory Visit: Payer: Self-pay | Admitting: Obstetrics

## 2022-05-17 DIAGNOSIS — N63 Unspecified lump in unspecified breast: Secondary | ICD-10-CM

## 2022-05-17 DIAGNOSIS — N6489 Other specified disorders of breast: Secondary | ICD-10-CM

## 2022-05-17 DIAGNOSIS — R928 Other abnormal and inconclusive findings on diagnostic imaging of breast: Secondary | ICD-10-CM

## 2022-06-01 ENCOUNTER — Ambulatory Visit
Admission: RE | Admit: 2022-06-01 | Discharge: 2022-06-01 | Disposition: A | Discharge status: Home / Self Care | Payer: BC Managed Care – PPO | Source: Ambulatory Visit | Attending: Obstetrics | Admitting: Obstetrics

## 2022-06-01 DIAGNOSIS — R928 Other abnormal and inconclusive findings on diagnostic imaging of breast: Secondary | ICD-10-CM

## 2022-06-01 DIAGNOSIS — N63 Unspecified lump in unspecified breast: Secondary | ICD-10-CM | POA: Insufficient documentation

## 2022-06-01 DIAGNOSIS — N6012 Diffuse cystic mastopathy of left breast: Secondary | ICD-10-CM | POA: Diagnosis not present

## 2022-06-01 DIAGNOSIS — N6489 Other specified disorders of breast: Secondary | ICD-10-CM | POA: Insufficient documentation

## 2022-06-01 DIAGNOSIS — R922 Inconclusive mammogram: Secondary | ICD-10-CM | POA: Diagnosis not present

## 2022-06-01 DIAGNOSIS — N6011 Diffuse cystic mastopathy of right breast: Secondary | ICD-10-CM | POA: Diagnosis not present

## 2022-08-29 DIAGNOSIS — H33323 Round hole, bilateral: Secondary | ICD-10-CM | POA: Diagnosis not present

## 2023-03-07 ENCOUNTER — Ambulatory Visit (INDEPENDENT_AMBULATORY_CARE_PROVIDER_SITE_OTHER): Payer: BC Managed Care – PPO | Admitting: Internal Medicine

## 2023-03-07 ENCOUNTER — Ambulatory Visit (INDEPENDENT_AMBULATORY_CARE_PROVIDER_SITE_OTHER)
Admission: RE | Admit: 2023-03-07 | Discharge: 2023-03-07 | Disposition: A | Discharge status: Home / Self Care | Payer: BC Managed Care – PPO | Source: Ambulatory Visit | Attending: Internal Medicine | Admitting: Internal Medicine

## 2023-03-07 ENCOUNTER — Encounter: Payer: Self-pay | Admitting: Internal Medicine

## 2023-03-07 VITALS — BP 104/76 | HR 81 | Temp 97.4°F | Ht 64.0 in | Wt 113.0 lb

## 2023-03-07 DIAGNOSIS — R0781 Pleurodynia: Secondary | ICD-10-CM

## 2023-03-07 DIAGNOSIS — Z86711 Personal history of pulmonary embolism: Secondary | ICD-10-CM

## 2023-03-07 DIAGNOSIS — R0789 Other chest pain: Secondary | ICD-10-CM | POA: Diagnosis not present

## 2023-03-07 NOTE — Progress Notes (Signed)
Subjective:    Patient ID: Andrea Perkins, female    DOB: [birth year], 42 y.o.   MRN: [id number]  HPI Here due to upper back pain  Notices left thoracic pain--with certain movements (like twisting) Also notes with deep inhalation Noticed it about 2 weeks ago---no injury that she knows (but does lift weights)  No fever or SOB Some cough---just from allergies  Tried 2 ibuprofen--may have helped Seemed to hurt more after husband kneaded it  No current outpatient medications on file prior to visit.   No current facility-administered medications on file prior to visit.    Allergies  Allergen Reactions   Amoxicillin Anaphylaxis and Rash    this medication makes pt's ears itch.   Amoxicillin Itching   Lactulose Other (See Comments)   Milk-Related Compounds Hives    Along with congestion   Polysporin [Bacitracin-Polymyxin B] Itching   Gold-Containing Drug Products Rash    Past Medical History:  Diagnosis Date   Atypical mole 01/14/2016   R prox ant lateral thigh/mild   Chicken pox    Family history of breast cancer    3/22 cancer genetic testing letter sent   H/O pulmonary embolus during pregnancy 09/22/2015   Pulmonary embolism affecting pregnancy 3/16   Pulmonary embolism affecting pregnancy 4/16    Past Surgical History:  Procedure Laterality Date   VAGINAL DELIVERY     x 3   WISDOM TOOTH EXTRACTION     WISDOM TOOTH EXTRACTION      Family History  Problem Relation Age of Onset   Hypertension Mother    Hyperlipidemia Mother    Breast cancer Mother 60       and age 8   Thyroid disease Sister    Alcohol abuse Brother    Drug abuse Brother    Cancer Brother        "pleural" cancer   Alcohol abuse Maternal Grandfather    Drug abuse Maternal Grandfather    Mental illness Maternal Grandfather        ?bipolar    Social History   Socioeconomic History   Marital status: Married    Spouse name: Not on file   Number of children: 4   Years of education:  Not on file   Highest education level: Bachelor's degree (e.g., BA, AB, BS)  Occupational History   Occupation: Stay at home mom  Tobacco Use   Smoking status: Never   Smokeless tobacco: Never  Substance and Sexual Activity   Alcohol use: No    Alcohol/week: 0.0 standard drinks of alcohol   Drug use: No   Sexual activity: Yes    Birth control/protection: Condom  Other Topics Concern   Not on file  Social History Narrative   Husband teaches finance at [employer] Strain: Low Risk  (03/07/2023)   Overall Financial Resource Strain (CARDIA)    Difficulty of Paying Living Expenses: Not hard at all  Food Insecurity: No Food Insecurity (03/07/2023)   Hunger Vital Sign    Worried About Running Out of Food in the Last Year: Never true    Ran Out of Food in the Last Year: Never true  Transportation Needs: No Transportation Needs (03/07/2023)   PRAPARE - Hydrologist (Medical): No    Lack of Transportation (Non-Medical): No  Physical Activity: Sufficiently Active (03/07/2023)   Exercise Vital Sign    Days of Exercise per Week: 4 days  Minutes of Exercise per Session: 60 min  Stress: No Stress Concern Present (03/07/2023)   [place]    Feeling of Stress : Only a little  Social Connections: Socially Integrated (03/07/2023)   Social Connection and Isolation Panel [NHANES]    Frequency of Communication with Friends and Family: Once a week    Frequency of Social Gatherings with Friends and Family: More than three times a week    Attends Religious Services: More than 4 times per year    Active Member of [employer] or Organizations: Yes    Attends Music therapist: More than 4 times per year    Marital Status: Married  Human resources officer Violence: Not on file   Review of Systems No chest pain No change in exercise tolerance Did have pulmonary  embolus during pregnancy    Objective:   Physical Exam Constitutional:      Appearance: Normal appearance.  Cardiovascular:     Rate and Rhythm: Normal rate and regular rhythm.     Heart sounds: No murmur heard.    No gallop.  Pulmonary:     Effort: Pulmonary effort is normal.     Breath sounds: Normal breath sounds. No wheezing or rales.     Comments: No rib tenderness Musculoskeletal:     Cervical back: Neck supple.     Right lower leg: No edema.     Left lower leg: No edema.  Lymphadenopathy:     Cervical: No cervical adenopathy.  Neurological:     Mental Status: She is alert.            Assessment & Plan:

## 2023-03-07 NOTE — Assessment & Plan Note (Addendum)
Left sided pain for 2 weeks No signs of infection Could be chest wall --but findings not conclusive No change in exercise tolerance or dyspnea to suggest recurrence of PE--but low possibility (had pregnancy related PE but I don't see hypercoagulation work up back in 2016 when this was) Will check CXR to confirm no obvious pleural process  CXR shows nothing on left side ?slight fibrotic changes on right (maybe)  If ongoing symptoms, would need to check CT angiogram--given her history of PE

## 2023-04-12 ENCOUNTER — Ambulatory Visit (INDEPENDENT_AMBULATORY_CARE_PROVIDER_SITE_OTHER): Payer: BC Managed Care – PPO | Admitting: Dermatology

## 2023-04-12 VITALS — BP 101/72

## 2023-04-12 DIAGNOSIS — Z79899 Other long term (current) drug therapy: Secondary | ICD-10-CM

## 2023-04-12 DIAGNOSIS — Z7189 Other specified counseling: Secondary | ICD-10-CM | POA: Diagnosis not present

## 2023-04-12 DIAGNOSIS — L237 Allergic contact dermatitis due to plants, except food: Secondary | ICD-10-CM | POA: Diagnosis not present

## 2023-04-12 MED ORDER — PREDNISONE 5 MG PO TABS: 5.0000 mg | ORAL_TABLET | Freq: Every day | ORAL | 0 refills | Status: AC

## 2023-04-12 NOTE — Patient Instructions (Addendum)
3 Week Prednisone Taper  You will be given a prescription for 150 tablets of oral Prednisone. It is very important that you take this according to the exact schedule provided below. This type of regimen for taking medication is often called a "taper", because your dosage will steadily decrease over a two week period until it is discontinued altogether.  ALWAYS take this medicine with food to prevent it from irritating your stomach. You should also take your Prednisone during morning hours.  Call the clinic at [phone] if you gain more than two pounds in one day, notice swelling anywhere on your body, have shortness of breath, black or red bowel movements, brown or red vomitus, desire to drink large amounts of fluids, a fever, or extreme weakness.   Oral Prednisone over Three Weeks  Day  Week 1  Week 2  Week 3   1  12  tablets  9 tablets  5 tablets   2  12 tablets  8 tablets  5 tablets   3  11 tablets  8 tablets  4 tablets   4  11 tablets  7 tablets  4 tablets   5  10 tablets  7 tablets  3 tablets   6  10 tablets  6 tablets  2 tablets   7  9 tablets  6 tablets  1 tablet    Risks of prednisone taper including mood irritability, insomnia, weight gain, stomach ulcers, increased risk of infection, increased blood sugar (diabetes), hypertension, osteoporosis with long-term or frequent use, and rare risk of avascular necrosis of the hip.    Due to recent changes in healthcare laws, you may see results of your pathology and/or laboratory studies on MyChart before the doctors have had a chance to review them. We understand that in some cases there may be results that are confusing or concerning to you. Please understand that not all results are received at the same time and often the doctors may need to interpret multiple results in order to provide you with the best plan of care or course of treatment. Therefore, we ask that you please give [place] 2 business days to thoroughly review all your results  before contacting the office for clarification. Should we see a critical lab result, you will be contacted sooner.   If You Need Anything After Your Visit  If you have any questions or concerns for your doctor, please call our main line at [phone] and press option 4 to reach your doctor's medical assistant. If no one answers, please leave a voicemail as directed and we will return your call as soon as possible. Messages left after 4 pm will be answered the following business day.   You may also send [place] a message via MyChart. We typically respond to MyChart messages within 1-2 business days.  For prescription refills, please ask your pharmacy to contact our office. Our fax number is [phone].  If you have an urgent issue when the clinic is closed that cannot wait until the next business day, you can page your doctor at the number below.    Please note that while we do our best to be available for urgent issues outside of office hours, we are not available 24/7.   If you have an urgent issue and are unable to reach [place], you may choose to seek medical care at your doctor's office, retail clinic, urgent care center, or emergency room.  If you have a medical emergency, please immediately call 911 or  go to the emergency department.  Pager Numbers  - Dr. Gwen Pounds: [phone]  - Dr. Neale Burly: [phone]  - Dr. Roseanne Reno: [phone]  In the event of inclement weather, please call our main line at [phone] for an update on the status of any delays or closures.  Dermatology Medication Tips: Please keep the boxes that topical medications come in in order to help keep track of the instructions about where and how to use these. Pharmacies typically print the medication instructions only on the boxes and not directly on the medication tubes.   If your medication is too expensive, please contact our office at [phone] option 4 or send [place] a message through MyChart.   We are unable to  tell what your co-pay for medications will be in advance as this is different depending on your insurance coverage. However, we may be able to find a substitute medication at lower cost or fill out paperwork to get insurance to cover a needed medication.   If a prior authorization is required to get your medication covered by your insurance company, please allow [place] 1-2 business days to complete this process.  Drug prices often vary depending on where the prescription is filled and some pharmacies may offer cheaper prices.  The website www.goodrx.com contains coupons for medications through different pharmacies. The prices here do not account for what the cost may be with help from insurance (it may be cheaper with your insurance), but the website can give you the price if you did not use any insurance.  - You can print the associated coupon and take it with your prescription to the pharmacy.  - You may also stop by our office during regular business hours and pick up a GoodRx coupon card.  - If you need your prescription sent electronically to a different pharmacy, notify our office through [facility] or by phone at [phone] option 4.     Si Usted Necesita Algo Despus de Su Visita  Tambin puede enviarnos un mensaje a travs de Clinical cytogeneticist. Por lo general respondemos a los mensajes de MyChart en el transcurso de 1 a 2 das hbiles.  Para renovar recetas, por favor pida a su farmacia que se ponga en contacto con nuestra oficina. Annie Sable de fax es [place] [phone].  Si tiene un asunto urgente cuando la clnica est cerrada y que no puede esperar hasta el siguiente da hbil, puede llamar/localizar a su doctor(a) al nmero que aparece a continuacin.   Por favor, tenga en cuenta que aunque hacemos todo lo posible para estar disponibles para asuntos urgentes fuera del horario de [place], no estamos disponibles las 24 horas del da, los 7 [address]  [address] de la [place].   Si tiene un problema  urgente y no puede comunicarse con nosotros, puede optar por buscar atencin mdica  en el consultorio de su doctor(a), en una clnica privada, en un centro de atencin urgente o en una sala de emergencias.  Si tiene Engineer, drilling, por favor llame inmediatamente al 911 o vaya a la sala de emergencias.  Nmeros de bper  - Dr. Gwen Pounds: [phone]  - Dra. Moye: [phone]  - Dra. Roseanne Reno: [phone]  En caso de inclemencias del [place], por favor llame a Lacy Duverney principal al [phone] para una actualizacin sobre el [place] de cualquier retraso o cierre.  Consejos para la medicacin en dermatologa: Por favor, guarde las cajas en las que vienen los medicamentos de uso tpico para ayudarle a seguir las instrucciones sobre dnde y  cmo usarlos. Las farmacias generalmente imprimen las instrucciones del medicamento slo en las cajas y no directamente en los tubos del [place].   Si su medicamento es muy caro, por favor, pngase en contacto con Rolm Gala llamando al [phone] y presione la opcin 4 o envenos un mensaje a travs de Clinical cytogeneticist.   No podemos decirle cul ser su copago por los medicamentos por adelantado ya que esto es diferente dependiendo de la cobertura de su seguro. Sin embargo, es posible que podamos encontrar un medicamento sustituto a Audiological scientist un formulario para que el seguro cubra el medicamento que se considera necesario.   Si se requiere una autorizacin previa para que su compaa de seguros [place] su medicamento, por favor permtanos de 1 a 2 das hbiles para completar [address]  Los precios de los medicamentos varan con frecuencia dependiendo del Environmental consultant de dnde se surte la receta y alguna farmacias pueden ofrecer precios ms baratos.  El sitio web www.goodrx.com tiene cupones para medicamentos de Health and safety inspector. Los precios aqu no tienen en cuenta lo que podra costar con la ayuda del seguro (puede ser ms barato con  su seguro), pero el sitio web puede darle el precio si no utiliz Tourist information centre manager.  - Puede imprimir el cupn correspondiente y llevarlo con su receta a la farmacia.  - Tambin puede pasar por nuestra oficina durante el horario de atencin regular y Education officer, museum una tarjeta de cupones de GoodRx.  - Si necesita que su receta se enve electrnicamente a una farmacia diferente, informe a nuestra oficina a travs de MyChart de [place] o por telfono llamando al [phone] y presione la opcin 4.

## 2023-04-12 NOTE — Progress Notes (Signed)
   Follow-Up Visit   Subjective  Andrea Perkins is a 42 y.o. female who presents for the following: poison Ivy hands, face, arms, L ankle 6 days, Xyzal, no topical medication, itchy, keeping her up at night   The following portions of the chart were reviewed this encounter and updated as appropriate: medications, allergies, medical history  Review of Systems:  No other skin or systemic complaints except as noted in HPI or Assessment and Plan.  Objective  Well appearing patient in no apparent distress; mood and affect are within normal limits.   A focused examination was performed of the following areas: Face, arms, hands, L ankle  Relevant exam findings are noted in the Assessment and Plan.    Assessment & Plan   ALLERGIC CONTACT DERMATITIS R/t poison ivy Exam: scaly pink papules with vesiculation face, arms, hands, L ankle   Treatment Plan: Start Prednisone 5mg  3 wk taper as directed dsp 150, 0rf 3 week prednisone taper information given to patient   Risks of prednisone taper discussed including mood irritability, insomnia, weight gain, stomach ulcers, increased risk of infection, increased blood sugar (diabetes), hypertension, osteoporosis with long-term or frequent use, and rare risk of avascular necrosis of the hip.    Return if symptoms worsen or fail to improve.  I, Ardis Rowan, RMA, am acting as scribe for Armida Sans, MD .   Documentation: I have reviewed the above documentation for accuracy and completeness, and I agree with the above.  Armida Sans, MD

## 2023-04-19 ENCOUNTER — Encounter: Payer: Self-pay | Admitting: Dermatology

## 2023-07-03 ENCOUNTER — Other Ambulatory Visit: Payer: Self-pay | Admitting: Internal Medicine

## 2023-07-03 DIAGNOSIS — Z1231 Encounter for screening mammogram for malignant neoplasm of breast: Secondary | ICD-10-CM

## 2023-07-14 ENCOUNTER — Ambulatory Visit
Admission: RE | Admit: 2023-07-14 | Discharge: 2023-07-14 | Disposition: A | Discharge status: Home / Self Care | Payer: BC Managed Care – PPO | Source: Ambulatory Visit | Attending: Internal Medicine | Admitting: Internal Medicine

## 2023-07-14 DIAGNOSIS — Z1231 Encounter for screening mammogram for malignant neoplasm of breast: Secondary | ICD-10-CM

## 2024-04-03 DIAGNOSIS — H524 Presbyopia: Secondary | ICD-10-CM | POA: Diagnosis not present

## 2024-04-03 DIAGNOSIS — H52223 Regular astigmatism, bilateral: Secondary | ICD-10-CM | POA: Diagnosis not present

## 2024-04-03 DIAGNOSIS — H5213 Myopia, bilateral: Secondary | ICD-10-CM | POA: Diagnosis not present

## 2024-05-06 DIAGNOSIS — L570 Actinic keratosis: Secondary | ICD-10-CM | POA: Diagnosis not present

## 2024-05-06 DIAGNOSIS — L738 Other specified follicular disorders: Secondary | ICD-10-CM | POA: Diagnosis not present
